# Patient Record
Sex: Male | Born: 1960 | Race: Black or African American | Hispanic: No | Marital: Single | Smoking: Current some day smoker
Health system: Southern US, Community
[De-identification: ages and names within clinical notes are randomized; demographics above are authoritative.]

## PROBLEM LIST (undated history)

## (undated) DIAGNOSIS — I10 Essential (primary) hypertension: Secondary | ICD-10-CM

---

## 2018-09-12 ENCOUNTER — Encounter (HOSPITAL_COMMUNITY): Payer: Self-pay | Admitting: Emergency Medicine

## 2018-09-12 ENCOUNTER — Other Ambulatory Visit: Payer: Self-pay

## 2018-09-12 ENCOUNTER — Emergency Department (HOSPITAL_COMMUNITY)
Admission: EM | Admit: 2018-09-12 | Discharge: 2018-09-12 | Disposition: A | Discharge status: Home / Self Care | Payer: Self-pay | Attending: Emergency Medicine | Admitting: Emergency Medicine

## 2018-09-12 DIAGNOSIS — Z0279 Encounter for issue of other medical certificate: Secondary | ICD-10-CM | POA: Insufficient documentation

## 2018-09-12 DIAGNOSIS — J069 Acute upper respiratory infection, unspecified: Secondary | ICD-10-CM | POA: Insufficient documentation

## 2018-09-12 DIAGNOSIS — Z0289 Encounter for other administrative examinations: Secondary | ICD-10-CM

## 2018-09-12 DIAGNOSIS — I10 Essential (primary) hypertension: Secondary | ICD-10-CM | POA: Insufficient documentation

## 2018-09-12 NOTE — ED Notes (Signed)
Bed: WA08 Expected date:  Expected time:  Means of arrival:  Comments: 

## 2018-09-12 NOTE — Discharge Instructions (Addendum)
Do not work if you have fevers, cough, or difficulty breathing. Get rechecked immediately if you develop difficulty breathing.

## 2018-09-12 NOTE — ED Provider Notes (Signed)
[facility]-EMERGENCY DEPT Provider Note   CSN: [id number] Arrival date & time: 09/12/18  0741    History   Chief Complaint Chief Complaint  Patient presents with  . needs work note    HPI Ricky Hernandez is a 58 y.o. male.     The history is provided by the patient. No language interpreter was used.   Ricky Hernandez is a 58 y.o. male who presents to the Emergency Department complaining of work note. He presents to the emergency department requesting a note to return to work. Last Saturday, nine days ago he developed symptoms of an upper respiratory infection with nasal congestion, sneezing, coughing, sore throat. Overall his symptoms have been resolved since Thursday. He did miss two days of work due to his symptoms. Today he requests a note stating that he can return to work. He has no known sick contacts. He has no medical problems. He denies any chest pain, shortness of breath, nausea, vomiting, fevers. Symptoms are mild and resolving. History reviewed. No pertinent past medical history.  There are no active problems to display for this patient.   History reviewed. No pertinent surgical history.      Home Medications    Prior to Admission medications   Not on File    Family History No family history on file.  Social History Social History   Tobacco Use  . Smoking status: Never Smoker  . Smokeless tobacco: Never Used  Substance Use Topics  . Alcohol use: Yes  . Drug use: Not on file     Allergies   Patient has no known allergies.   Review of Systems Review of Systems  All other systems reviewed and are negative.    Physical Exam Updated Vital Signs BP (!) 205/102 (BP Location: Left Arm)   Pulse 95   Temp 98.2 F (36.8 C) (Oral)   Resp 20   SpO2 98%   Physical Exam Vitals signs and nursing note reviewed.  Constitutional:      Appearance: He is well-developed.  HENT:     Head: Normocephalic and atraumatic.  Cardiovascular:      Rate and Rhythm: Normal rate and regular rhythm.     Heart sounds: No murmur.  Pulmonary:     Effort: Pulmonary effort is normal. No respiratory distress.     Breath sounds: Normal breath sounds.  Abdominal:     Palpations: Abdomen is soft.     Tenderness: There is no abdominal tenderness. There is no guarding or rebound.  Musculoskeletal:        General: No swelling or tenderness.  Skin:    General: Skin is warm and dry.  Neurological:     Mental Status: He is alert and oriented to person, place, and time.  Psychiatric:        Behavior: Behavior normal.      ED Treatments / Results  Labs (all labs ordered are listed, but only abnormal results are displayed) Labs Reviewed - No data to display  EKG None  Radiology No results found.  Procedures Procedures (including critical care time)  Medications Ordered in ED Medications - No data to display   Initial Impression / Assessment and Plan / ED Course  I have reviewed the triage vital signs and the nursing notes.  Pertinent labs & imaging results that were available during my care of the patient were reviewed by me and considered in my medical decision making (see chart for details).  Patient presents to the emergency department for a work excuse. He is well appearing on examination with no current symptoms. He is more than seven days since onset of his symptoms and has not had a fever during his illness. He has no risk factors for COVID exposure. Discussed with patient that if he is symptom-free he may return to work per [employer] guidelines. He does not meet current testing criteria for COVID virus.  Incidentally patient is noted to be hypertensive on ED assessment. He is asymptomatic at this time. He has no known history of hypertension. Discussed with patient importance of establishing a PCP for close outpatient follow-up and recheck. Return precautions discussed.  Ricky Hernandez was evaluated in Emergency  Department on 09/12/2018 for the symptoms described in the history of present illness. He was evaluated in the context of the global COVID-19 pandemic, which necessitated consideration that the patient might be at risk for infection with the SARS-CoV-2 virus that causes COVID-19. Institutional protocols and algorithms that pertain to the evaluation of patients at risk for COVID-19 are in a state of rapid change based on information released by regulatory bodies including the CDC and federal and state organizations. These policies and algorithms were followed during the patient's care in the ED.   Final Clinical Impressions(s) / ED Diagnoses   Final diagnoses:  Encounter to obtain excuse from work  Viral URI  Essential hypertension    ED Discharge Orders    None       Ricky Fossa, MD 09/12/18 [phone]

## 2018-09-12 NOTE — ED Triage Notes (Signed)
Pt reports that he was out of work last Monday and Tuesday for head cold and needs a note to return back. Denies being seen here and denies symptoms at this time.

## 2018-11-22 ENCOUNTER — Ambulatory Visit: Payer: Self-pay | Admitting: Internal Medicine

## 2020-06-24 ENCOUNTER — Emergency Department (HOSPITAL_COMMUNITY): Payer: Self-pay

## 2020-06-24 ENCOUNTER — Observation Stay (HOSPITAL_COMMUNITY)
Admission: EM | Admit: 2020-06-24 | Discharge: 2020-06-26 | Disposition: A | Discharge status: Home / Self Care | Payer: Self-pay | Attending: Emergency Medicine | Admitting: Emergency Medicine

## 2020-06-24 ENCOUNTER — Other Ambulatory Visit: Payer: Self-pay

## 2020-06-24 ENCOUNTER — Encounter (HOSPITAL_COMMUNITY): Payer: Self-pay

## 2020-06-24 DIAGNOSIS — S76119A Strain of unspecified quadriceps muscle, fascia and tendon, initial encounter: Secondary | ICD-10-CM | POA: Diagnosis present

## 2020-06-24 DIAGNOSIS — R2689 Other abnormalities of gait and mobility: Secondary | ICD-10-CM | POA: Insufficient documentation

## 2020-06-24 DIAGNOSIS — I10 Essential (primary) hypertension: Secondary | ICD-10-CM | POA: Insufficient documentation

## 2020-06-24 DIAGNOSIS — F17290 Nicotine dependence, other tobacco product, uncomplicated: Secondary | ICD-10-CM | POA: Insufficient documentation

## 2020-06-24 DIAGNOSIS — Z79899 Other long term (current) drug therapy: Secondary | ICD-10-CM | POA: Insufficient documentation

## 2020-06-24 DIAGNOSIS — M25562 Pain in left knee: Secondary | ICD-10-CM | POA: Insufficient documentation

## 2020-06-24 DIAGNOSIS — Z20822 Contact with and (suspected) exposure to covid-19: Secondary | ICD-10-CM | POA: Insufficient documentation

## 2020-06-24 DIAGNOSIS — S76102A Unspecified injury of left quadriceps muscle, fascia and tendon, initial encounter: Principal | ICD-10-CM | POA: Insufficient documentation

## 2020-06-24 DIAGNOSIS — W01198A Fall on same level from slipping, tripping and stumbling with subsequent striking against other object, initial encounter: Secondary | ICD-10-CM | POA: Insufficient documentation

## 2020-06-24 DIAGNOSIS — S76112A Strain of left quadriceps muscle, fascia and tendon, initial encounter: Secondary | ICD-10-CM | POA: Diagnosis present

## 2020-06-24 HISTORY — DX: Essential (primary) hypertension: I10

## 2020-06-24 MED ORDER — OXYCODONE-ACETAMINOPHEN 5-325 MG PO TABS
1.0000 | ORAL_TABLET | ORAL | Status: DC | PRN
  Administered 2020-06-24: 1 via ORAL
  Filled 2020-06-24: qty 1

## 2020-06-24 NOTE — ED Provider Notes (Addendum)
[facility] EMERGENCY DEPARTMENT Provider Note   CSN: [id number] Arrival date & time: 06/24/20  1020     History Chief Complaint  Patient presents with  . Fall  . Knee Pain  . Hip Pain    Ricky Hernandez is a 60 y.o. male with no significant past medical history presents to the ED after mechanical fall 2 days ago.  Patient states he was walking outside his door, slipped, and fell directly on his left knee. He admits to severe, 10/10 pain following the fall. Pain is worse with movement. Notes pain is located on the anterior aspect of knee and quadricepts. He is unable to fully extend left leg. Denies associated numbness/tingling. No previous injury.  Denies head injury loss of consciousness.  He is not currently on any blood thinners.  Patient states he has been unable to bear weight since the accident.  No treatment prior to arrival  History obtained from patient and past medical records. No interpreter used during encounter.      History reviewed. No pertinent past medical history.  There are no problems to display for this patient.   History reviewed. No pertinent surgical history.     History reviewed. No pertinent family history.  Social History   Tobacco Use  . Smoking status: Never Smoker  . Smokeless tobacco: Never Used  Vaping Use  . Vaping Use: Never used  Substance Use Topics  . Alcohol use: Yes    Home Medications Prior to Admission medications   Not on File    Allergies    Patient has no known allergies.  Review of Systems   Review of Systems  Musculoskeletal: Positive for arthralgias, gait problem and myalgias.  Neurological: Negative for numbness.  All other systems reviewed and are negative.   Physical Exam Updated Vital Signs BP (!) 164/98   Pulse 70   Temp 98.9 F (37.2 C)   Resp 17   Ht 5\' 9"  (1.753 m)   Wt 63.5 kg   SpO2 97%   BMI 20.67 kg/m   Physical Exam Vitals and nursing note reviewed.  Constitutional:       General: He is not in acute distress.    Appearance: He is not ill-appearing.  HENT:     Head: Normocephalic.  Eyes:     Pupils: Pupils are equal, round, and reactive to light.  Cardiovascular:     Rate and Rhythm: Normal rate and regular rhythm.     Pulses: Normal pulses.     Heart sounds: Normal heart sounds. No murmur heard. No friction rub. No gallop.   Pulmonary:     Effort: Pulmonary effort is normal.     Breath sounds: Normal breath sounds.  Abdominal:     General: Abdomen is flat. There is no distension.     Palpations: Abdomen is soft.     Tenderness: There is no abdominal tenderness. There is no guarding or rebound.  Musculoskeletal:     Cervical back: Neck supple.     Comments: Tenderness over anterior aspect of left quads. Unable to extend leg. Soft compartments. Distal pulses and sensation intact. Unable to ambulate here in the ED.   Skin:    General: Skin is warm and dry.  Neurological:     General: No focal deficit present.     Mental Status: He is alert.     ED Results / Procedures / Treatments   Labs (all labs ordered are listed, but only abnormal results are  displayed) Labs Reviewed - No data to display  EKG None  Radiology DG Knee Complete 4 Views Left  Result Date: 06/24/2020 CLINICAL DATA:  Fall from porch 06/22/2020, hip and knee pain. EXAM: LEFT KNEE - COMPLETE 4+ VIEW COMPARISON:  None. FINDINGS: A string like cluster of abnormal ossific densities is present along what appears to be a markedly thickened and indistinct distal quadriceps tendon. Although these ossifications may be chronic, the possibility of a rupture of the distal quadriceps is raised given the associated regional swelling. Scalloping and mild irregularity of the upper patella noted. There is also some mild spurring and potentially a small ossification adjacent to the lower pole of the patella. Indistinct suprapatellar bursa. Lateral spurring of the patella and of the fibular head.  Flat ossific density along the posterior margin of the distal tibia probably along the origination of the soleus muscle based on configuration. IMPRESSION: 1. Abnormal appearance of the distal quadriceps tendon, with thickening and indistinctness of the distal quadriceps tendon concerning for possible rupture. There are also some clustered ossifications in the distal quadriceps which may indicate degeneration. Electronically Signed   By: Gaylyn Rong M.D.   On: 06/24/2020 12:32   DG Hip Unilat With Pelvis 2-3 Views Left  Result Date: 06/24/2020 CLINICAL DATA:  Fall from porch, hip pain EXAM: DG HIP (WITH OR WITHOUT PELVIS) 2-3V LEFT COMPARISON:  None. FINDINGS: Small ossification in the vicinity of the left hip adductor musculature, probably chronic. Similar likely chronic ossification along the right hamstring. Unusual ossification medially along the right pubic rami, cannot exclude deformity related to an old fracture in this vicinity. Probable synovial herniation pit along the right femoral head. Lower lumbar spondylosis. Mild spurring of the left femoral head. I do not observe a discrete fracture involving the left hip. IMPRESSION: 1. 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 2. Mild spurring of the left femoral head. 3. Lower lumbar spondylosis. Electronically Signed   By: Gaylyn Rong M.D.   On: 06/24/2020 12:39    Procedures Procedures (including critical care time)  Medications Ordered in ED Medications  oxyCODONE-acetaminophen (PERCOCET/ROXICET) 5-325 MG per tablet 1 tablet (1 tablet Oral Given 06/24/20 1107)    ED Course  I have reviewed the triage vital signs and the nursing notes.  Pertinent labs & imaging results that were available during my care of the patient were reviewed by me and considered in my medical decision making (see chart for details).    MDM  Rules/Calculators/A&P                         60 year old male presents to the ED after mechanical fall 2 days ago.  No head injury or loss of consciousness.  Patient admits to left knee/quadricep pain worse with ambulation.  Unfortunately, patient waited over 12 hours prior to my initial evaluation.  Patient hypertensive upon arrival at 181/111 however, most recent vitals shows improvement in BP at 164/98.  Low suspicion for hypertensive urgency/emergency given patient is asymptomatic.  Patient in no acute distress and nontoxic-appearing.  Physical exam significant for tenderness throughout left quadriceps with inability to fully extend left leg.  Distal pulses and sensation intact. Soft compartments.  Doubt compartment syndrome.  Left hip and knee X-rays ordered at triage which I personally reviewed which demonstrate: IMPRESSION:  1. Ossifications along the left hip adductor musculature, right  pubic rami, and right hamstring tendon seem to be chronic and no  donor site is identified.  2. Mild spurring of the left femoral head.  3. Lower lumbar spondylosis.   IMPRESSION:  1. Abnormal appearance of the distal quadriceps tendon, with  thickening and indistinctness of the distal quadriceps tendon  concerning for possible rupture. There are also some clustered  ossifications in the distal quadriceps which may indicate  degeneration.    10:53 PM discussed case with Dr. Carola Frost with orthopedics who recommends MRI left knee to rule out full quadricep rupture.  If full rupture, orthopedics will admit for surgery. If partial rupture, he recommends medical admission for pain management and orthopedics will follow.   Informed by RN that patient refused labs.  Patient handed off to Dr. Judd Lien pending MRI. Patient will most likely need admission as noted above.   Final Clinical Impression(s) / ED Diagnoses Final diagnoses:  None    Rx / DC Orders ED Discharge Orders    None       Mannie Stabile, PA-C 06/24/20 2347    Mannie Stabile, PA-C 06/24/20 2348    Geoffery Lyons, MD 06/25/20 [phone]

## 2020-06-24 NOTE — ED Notes (Signed)
ED Provider at bedside. 

## 2020-06-24 NOTE — ED Notes (Signed)
Patient refusing blood work at this time, voiced frustration regarding further testing, does agree to MRI. Patient requesting PO fluids, given as requested, OK per [place], [place].

## 2020-06-24 NOTE — ED Triage Notes (Signed)
Pt reports pain to his Left hip and knee after falling Saturday night. Pt states he slipped off his porch while he was trying to go down his steps. Pt unable to put any weight on his Left side.

## 2020-06-25 ENCOUNTER — Encounter (HOSPITAL_COMMUNITY): Payer: Self-pay | Admitting: Orthopedic Surgery

## 2020-06-25 ENCOUNTER — Emergency Department (HOSPITAL_COMMUNITY): Payer: Self-pay

## 2020-06-25 ENCOUNTER — Observation Stay (HOSPITAL_COMMUNITY): Payer: Self-pay | Admitting: Anesthesiology

## 2020-06-25 ENCOUNTER — Encounter (HOSPITAL_COMMUNITY)
Admission: EM | Disposition: A | Discharge status: Home / Self Care | Payer: Self-pay | Source: Home / Self Care | Attending: Emergency Medicine

## 2020-06-25 ENCOUNTER — Observation Stay (HOSPITAL_COMMUNITY): Payer: Self-pay

## 2020-06-25 DIAGNOSIS — S76119A Strain of unspecified quadriceps muscle, fascia and tendon, initial encounter: Secondary | ICD-10-CM | POA: Insufficient documentation

## 2020-06-25 DIAGNOSIS — S76112A Strain of left quadriceps muscle, fascia and tendon, initial encounter: Secondary | ICD-10-CM | POA: Diagnosis present

## 2020-06-25 HISTORY — PX: QUADRICEPS TENDON REPAIR: SHX756

## 2020-06-25 HISTORY — PX: OTHER SURGICAL HISTORY: SHX169

## 2020-06-25 LAB — CBC WITH DIFFERENTIAL/PLATELET
Abs Immature Granulocytes: 0.03 10*3/uL (ref 0.00–0.07)
Basophils Absolute: 0 10*3/uL (ref 0.0–0.1)
Basophils Relative: 1 %
Eosinophils Absolute: 0.1 10*3/uL (ref 0.0–0.5)
Eosinophils Relative: 1 %
HCT: 43.8 % (ref 39.0–52.0)
Hemoglobin: 14.4 g/dL (ref 13.0–17.0)
Immature Granulocytes: 1 %
Lymphocytes Relative: 14 %
Lymphs Abs: 0.9 10*3/uL (ref 0.7–4.0)
MCH: 30.4 pg (ref 26.0–34.0)
MCHC: 32.9 g/dL (ref 30.0–36.0)
MCV: 92.4 fL (ref 80.0–100.0)
Monocytes Absolute: 0.7 10*3/uL (ref 0.1–1.0)
Monocytes Relative: 11 %
Neutro Abs: 4.9 10*3/uL (ref 1.7–7.7)
Neutrophils Relative %: 72 %
Platelets: 279 10*3/uL (ref 150–400)
RBC: 4.74 MIL/uL (ref 4.22–5.81)
RDW: 14.7 % (ref 11.5–15.5)
WBC: 6.7 10*3/uL (ref 4.0–10.5)
nRBC: 0 % (ref 0.0–0.2)

## 2020-06-25 LAB — RESP PANEL BY RT-PCR (FLU A&B, COVID) ARPGX2
Influenza A by PCR: NEGATIVE
Influenza B by PCR: NEGATIVE
SARS Coronavirus 2 by RT PCR: NEGATIVE

## 2020-06-25 LAB — BASIC METABOLIC PANEL
Anion gap: 13 (ref 5–15)
BUN: 19 mg/dL (ref 8–23)
CO2: 22 mmol/L (ref 22–32)
Calcium: 9.2 mg/dL (ref 8.9–10.3)
Chloride: 102 mmol/L (ref 98–111)
Creatinine, Ser: 0.77 mg/dL (ref 0.61–1.24)
GFR, Estimated: >60 mL/min (ref >60)
Glucose, Bld: 97 mg/dL (ref 70–99)
Potassium: 4.6 mmol/L (ref 3.5–5.1)
Sodium: 137 mmol/L (ref 135–145)

## 2020-06-25 LAB — PROTIME-INR
INR: 1 (ref 0.8–1.2)
Prothrombin Time: 12.5 seconds (ref 11.4–15.2)

## 2020-06-25 SURGERY — REPAIR, TENDON, QUADRICEPS
Anesthesia: General | Site: Thigh | Laterality: Right

## 2020-06-25 MED ORDER — HYDROXYZINE HCL 25 MG PO TABS
50.0000 mg | ORAL_TABLET | Freq: Three times a day (TID) | ORAL | Status: DC | PRN
  Filled 2020-06-25: qty 2

## 2020-06-25 MED ORDER — PHENYLEPHRINE 40 MCG/ML (10ML) SYRINGE FOR IV PUSH (FOR BLOOD PRESSURE SUPPORT)
PREFILLED_SYRINGE | INTRAVENOUS | Status: DC | PRN
  Administered 2020-06-25: 80 ug via INTRAVENOUS

## 2020-06-25 MED ORDER — OXYCODONE HCL 5 MG PO TABS
ORAL_TABLET | ORAL | Status: AC
  Filled 2020-06-25: qty 1

## 2020-06-25 MED ORDER — ASPIRIN 325 MG PO TABS
325.0000 mg | ORAL_TABLET | Freq: Two times a day (BID) | ORAL | Status: DC
  Administered 2020-06-25 – 2020-06-26 (×2): 325 mg via ORAL
  Filled 2020-06-25 (×2): qty 1

## 2020-06-25 MED ORDER — MIDAZOLAM HCL 2 MG/2ML IJ SOLN
INTRAMUSCULAR | Status: AC
  Filled 2020-06-25: qty 2

## 2020-06-25 MED ORDER — KETOROLAC TROMETHAMINE 15 MG/ML IJ SOLN
7.5000 mg | Freq: Four times a day (QID) | INTRAMUSCULAR | Status: AC
  Administered 2020-06-25 – 2020-06-26 (×4): 7.5 mg via INTRAVENOUS
  Filled 2020-06-25 (×3): qty 1

## 2020-06-25 MED ORDER — PHENYLEPHRINE HCL-NACL 10-0.9 MG/250ML-% IV SOLN
INTRAVENOUS | Status: DC | PRN
  Administered 2020-06-25: 20 ug/min via INTRAVENOUS

## 2020-06-25 MED ORDER — SUGAMMADEX SODIUM 200 MG/2ML IV SOLN
INTRAVENOUS | Status: DC | PRN
  Administered 2020-06-25: 200 mg via INTRAVENOUS

## 2020-06-25 MED ORDER — ONDANSETRON HCL 4 MG/2ML IJ SOLN
INTRAMUSCULAR | Status: DC | PRN
  Administered 2020-06-25: 4 mg via INTRAVENOUS

## 2020-06-25 MED ORDER — OXYCODONE HCL 5 MG PO TABS: 10.0000 mg | ORAL_TABLET | ORAL | Status: DC | PRN

## 2020-06-25 MED ORDER — HYDRALAZINE HCL 10 MG PO TABS: 10.0000 mg | ORAL_TABLET | Freq: Three times a day (TID) | ORAL | Status: DC | PRN

## 2020-06-25 MED ORDER — LABETALOL HCL 5 MG/ML IV SOLN
INTRAVENOUS | Status: AC
  Filled 2020-06-25: qty 4

## 2020-06-25 MED ORDER — PROPOFOL 10 MG/ML IV BOLUS
INTRAVENOUS | Status: DC | PRN
  Administered 2020-06-25: 50 mg via INTRAVENOUS
  Administered 2020-06-25: 150 mg via INTRAVENOUS

## 2020-06-25 MED ORDER — LIDOCAINE 2% (20 MG/ML) 5 ML SYRINGE
INTRAMUSCULAR | Status: DC | PRN
  Administered 2020-06-25: 60 mg via INTRAVENOUS

## 2020-06-25 MED ORDER — OXYCODONE HCL 5 MG PO TABS
5.0000 mg | ORAL_TABLET | ORAL | Status: DC | PRN
  Administered 2020-06-25: 10 mg via ORAL
  Filled 2020-06-25: qty 2

## 2020-06-25 MED ORDER — PROPOFOL 10 MG/ML IV BOLUS
INTRAVENOUS | Status: AC
  Filled 2020-06-25: qty 40

## 2020-06-25 MED ORDER — HYDROMORPHONE HCL 1 MG/ML IJ SOLN
INTRAMUSCULAR | Status: AC
  Filled 2020-06-25: qty 1

## 2020-06-25 MED ORDER — CHLORHEXIDINE GLUCONATE 0.12 % MT SOLN
15.0000 mL | Freq: Once | OROMUCOSAL | Status: AC
  Administered 2020-06-25: 15 mL via OROMUCOSAL
  Filled 2020-06-25: qty 15

## 2020-06-25 MED ORDER — METHOCARBAMOL 1000 MG/10ML IJ SOLN
500.0000 mg | Freq: Three times a day (TID) | INTRAMUSCULAR | Status: DC
  Filled 2020-06-25: qty 5

## 2020-06-25 MED ORDER — PHENYLEPHRINE 40 MCG/ML (10ML) SYRINGE FOR IV PUSH (FOR BLOOD PRESSURE SUPPORT)
PREFILLED_SYRINGE | INTRAVENOUS | Status: AC
  Filled 2020-06-25: qty 10

## 2020-06-25 MED ORDER — ONDANSETRON HCL 4 MG/2ML IJ SOLN
INTRAMUSCULAR | Status: AC
  Filled 2020-06-25: qty 2

## 2020-06-25 MED ORDER — HYDROMORPHONE HCL 1 MG/ML IJ SOLN
0.2500 mg | INTRAMUSCULAR | Status: DC | PRN
  Administered 2020-06-25 (×4): 0.5 mg via INTRAVENOUS

## 2020-06-25 MED ORDER — ACETAMINOPHEN 500 MG PO TABS
1000.0000 mg | ORAL_TABLET | Freq: Four times a day (QID) | ORAL | Status: DC
  Administered 2020-06-25 – 2020-06-26 (×5): 1000 mg via ORAL
  Filled 2020-06-25 (×4): qty 2

## 2020-06-25 MED ORDER — POLYETHYLENE GLYCOL 3350 17 G PO PACK
17.0000 g | PACK | Freq: Every day | ORAL | Status: DC
  Administered 2020-06-25: 17 g via ORAL
  Filled 2020-06-25 (×2): qty 1

## 2020-06-25 MED ORDER — FENTANYL CITRATE (PF) 250 MCG/5ML IJ SOLN
INTRAMUSCULAR | Status: DC | PRN
  Administered 2020-06-25 (×5): 50 ug via INTRAVENOUS

## 2020-06-25 MED ORDER — EPHEDRINE 5 MG/ML INJ
INTRAVENOUS | Status: AC
  Filled 2020-06-25: qty 10

## 2020-06-25 MED ORDER — FENTANYL CITRATE (PF) 250 MCG/5ML IJ SOLN
INTRAMUSCULAR | Status: AC
  Filled 2020-06-25: qty 5

## 2020-06-25 MED ORDER — DEXAMETHASONE SODIUM PHOSPHATE 10 MG/ML IJ SOLN
INTRAMUSCULAR | Status: DC | PRN
  Administered 2020-06-25: 5 mg via INTRAVENOUS

## 2020-06-25 MED ORDER — ONDANSETRON HCL 4 MG/2ML IJ SOLN: 4.0000 mg | Freq: Four times a day (QID) | INTRAMUSCULAR | Status: DC | PRN

## 2020-06-25 MED ORDER — HYDROMORPHONE BOLUS VIA INFUSION: 0.2500 mg | INTRAVENOUS | Status: DC | PRN

## 2020-06-25 MED ORDER — CEFAZOLIN SODIUM 1 G IJ SOLR
INTRAMUSCULAR | Status: AC
  Filled 2020-06-25: qty 20

## 2020-06-25 MED ORDER — 0.9 % SODIUM CHLORIDE (POUR BTL) OPTIME
TOPICAL | Status: DC | PRN
  Administered 2020-06-25: 1000 mL

## 2020-06-25 MED ORDER — LIDOCAINE 2% (20 MG/ML) 5 ML SYRINGE
INTRAMUSCULAR | Status: AC
  Filled 2020-06-25: qty 5

## 2020-06-25 MED ORDER — LABETALOL HCL 5 MG/ML IV SOLN
10.0000 mg | Freq: Once | INTRAVENOUS | Status: AC
  Administered 2020-06-25: 10 mg via INTRAVENOUS

## 2020-06-25 MED ORDER — EPHEDRINE SULFATE-NACL 50-0.9 MG/10ML-% IV SOSY
PREFILLED_SYRINGE | INTRAVENOUS | Status: DC | PRN
  Administered 2020-06-25: 10 mg via INTRAVENOUS

## 2020-06-25 MED ORDER — HYDROMORPHONE HCL 1 MG/ML IJ SOLN: 0.5000 mg | INTRAMUSCULAR | Status: DC | PRN

## 2020-06-25 MED ORDER — ORAL CARE MOUTH RINSE: 15.0000 mL | Freq: Once | OROMUCOSAL | Status: AC

## 2020-06-25 MED ORDER — CEFAZOLIN SODIUM-DEXTROSE 1-4 GM/50ML-% IV SOLN
1.0000 g | Freq: Four times a day (QID) | INTRAVENOUS | Status: AC
  Administered 2020-06-25 – 2020-06-26 (×3): 1 g via INTRAVENOUS
  Filled 2020-06-25 (×3): qty 50

## 2020-06-25 MED ORDER — FENTANYL CITRATE (PF) 100 MCG/2ML IJ SOLN
INTRAMUSCULAR | Status: AC
  Filled 2020-06-25: qty 2

## 2020-06-25 MED ORDER — POTASSIUM CHLORIDE IN NACL 20-0.9 MEQ/L-% IV SOLN
INTRAVENOUS | Status: DC
  Filled 2020-06-25: qty 1000

## 2020-06-25 MED ORDER — METOCLOPRAMIDE HCL 5 MG PO TABS: 5.0000 mg | ORAL_TABLET | Freq: Three times a day (TID) | ORAL | Status: DC | PRN

## 2020-06-25 MED ORDER — DEXAMETHASONE SODIUM PHOSPHATE 10 MG/ML IJ SOLN
INTRAMUSCULAR | Status: AC
  Filled 2020-06-25: qty 1

## 2020-06-25 MED ORDER — ROCURONIUM BROMIDE 10 MG/ML (PF) SYRINGE
PREFILLED_SYRINGE | INTRAVENOUS | Status: AC
  Filled 2020-06-25: qty 10

## 2020-06-25 MED ORDER — ACETAMINOPHEN 325 MG PO TABS: 325.0000 mg | ORAL_TABLET | Freq: Four times a day (QID) | ORAL | Status: DC | PRN

## 2020-06-25 MED ORDER — OXYCODONE HCL 5 MG/5ML PO SOLN: 5.0000 mg | Freq: Once | ORAL | Status: AC | PRN

## 2020-06-25 MED ORDER — SODIUM CHLORIDE 0.9 % IV SOLN: INTRAVENOUS | Status: DC

## 2020-06-25 MED ORDER — LACTATED RINGERS IV SOLN: INTRAVENOUS | Status: DC

## 2020-06-25 MED ORDER — METOCLOPRAMIDE HCL 5 MG/ML IJ SOLN: 5.0000 mg | Freq: Three times a day (TID) | INTRAMUSCULAR | Status: DC | PRN

## 2020-06-25 MED ORDER — DOCUSATE SODIUM 100 MG PO CAPS
100.0000 mg | ORAL_CAPSULE | Freq: Two times a day (BID) | ORAL | Status: DC
  Administered 2020-06-25 – 2020-06-26 (×3): 100 mg via ORAL
  Filled 2020-06-25 (×3): qty 1

## 2020-06-25 MED ORDER — OXYCODONE HCL 5 MG PO TABS
5.0000 mg | ORAL_TABLET | Freq: Once | ORAL | Status: AC | PRN
  Administered 2020-06-25: 5 mg via ORAL

## 2020-06-25 MED ORDER — ONDANSETRON HCL 4 MG PO TABS: 4.0000 mg | ORAL_TABLET | Freq: Four times a day (QID) | ORAL | Status: DC | PRN

## 2020-06-25 MED ORDER — ROCURONIUM BROMIDE 10 MG/ML (PF) SYRINGE
PREFILLED_SYRINGE | INTRAVENOUS | Status: DC | PRN
  Administered 2020-06-25: 10 mg via INTRAVENOUS
  Administered 2020-06-25: 60 mg via INTRAVENOUS

## 2020-06-25 MED ORDER — METHOCARBAMOL 750 MG PO TABS
750.0000 mg | ORAL_TABLET | Freq: Three times a day (TID) | ORAL | Status: DC
  Administered 2020-06-25 – 2020-06-26 (×3): 750 mg via ORAL
  Filled 2020-06-25 (×5): qty 1

## 2020-06-25 MED ORDER — FENTANYL CITRATE (PF) 100 MCG/2ML IJ SOLN
25.0000 ug | INTRAMUSCULAR | Status: DC | PRN
  Administered 2020-06-25 (×2): 50 ug via INTRAVENOUS

## 2020-06-25 MED ORDER — CEFAZOLIN SODIUM-DEXTROSE 2-3 GM-%(50ML) IV SOLR
INTRAVENOUS | Status: DC | PRN
  Administered 2020-06-25: 2 g via INTRAVENOUS

## 2020-06-25 MED ORDER — MIDAZOLAM HCL 2 MG/2ML IJ SOLN
INTRAMUSCULAR | Status: DC | PRN
  Administered 2020-06-25: 2 mg via INTRAVENOUS

## 2020-06-25 SURGICAL SUPPLY — 51 items
BNDG COHESIVE 4X5 TAN STRL (GAUZE/BANDAGES/DRESSINGS) ×3 IMPLANT
BNDG ELASTIC 4X5.8 VLCR STR LF (GAUZE/BANDAGES/DRESSINGS) ×3 IMPLANT
BNDG ELASTIC 6X5.8 VLCR STR LF (GAUZE/BANDAGES/DRESSINGS) ×3 IMPLANT
BNDG GAUZE ELAST 4 BULKY (GAUZE/BANDAGES/DRESSINGS) ×3 IMPLANT
BRUSH SCRUB EZ PLAIN DRY (MISCELLANEOUS) ×6 IMPLANT
COVER SURGICAL LIGHT HANDLE (MISCELLANEOUS) ×3 IMPLANT
COVER WAND RF STERILE (DRAPES) ×3 IMPLANT
DRAPE INCISE IOBAN 66X45 STRL (DRAPES) ×3 IMPLANT
DRAPE U-SHAPE 47X51 STRL (DRAPES) ×3 IMPLANT
DRSG ADAPTIC 3X8 NADH LF (GAUZE/BANDAGES/DRESSINGS) ×3 IMPLANT
DRSG MEPITEL 4X7.2 (GAUZE/BANDAGES/DRESSINGS) ×3 IMPLANT
DRSG PAD ABDOMINAL 8X10 ST (GAUZE/BANDAGES/DRESSINGS) ×3 IMPLANT
ELECT REM PT RETURN 9FT ADLT (ELECTROSURGICAL) ×3
ELECTRODE REM PT RTRN 9FT ADLT (ELECTROSURGICAL) ×2 IMPLANT
GAUZE SPONGE 4X4 12PLY STRL (GAUZE/BANDAGES/DRESSINGS) ×3 IMPLANT
GLOVE BIO SURGEON STRL SZ7.5 (GLOVE) ×3 IMPLANT
GLOVE BIO SURGEON STRL SZ8 (GLOVE) ×3 IMPLANT
GLOVE BIOGEL PI IND STRL 7.5 (GLOVE) ×2 IMPLANT
GLOVE BIOGEL PI IND STRL 8 (GLOVE) ×2 IMPLANT
GLOVE BIOGEL PI INDICATOR 7.5 (GLOVE) ×1
GLOVE BIOGEL PI INDICATOR 8 (GLOVE) ×1
GOWN STRL REUS W/ TWL LRG LVL3 (GOWN DISPOSABLE) ×4 IMPLANT
GOWN STRL REUS W/ TWL XL LVL3 (GOWN DISPOSABLE) ×2 IMPLANT
GOWN STRL REUS W/TWL LRG LVL3 (GOWN DISPOSABLE) ×2
GOWN STRL REUS W/TWL XL LVL3 (GOWN DISPOSABLE) ×1
IMMOBILIZER KNEE 20 (SOFTGOODS) ×3
IMMOBILIZER KNEE 20 THIGH 36 (SOFTGOODS) ×2 IMPLANT
KIT BASIN OR (CUSTOM PROCEDURE TRAY) ×3 IMPLANT
KIT TURNOVER KIT B (KITS) ×3 IMPLANT
MANIFOLD NEPTUNE II (INSTRUMENTS) ×3 IMPLANT
NEEDLE 1/2 CIR MAYO (NEEDLE) ×3 IMPLANT
NS IRRIG 1000ML POUR BTL (IV SOLUTION) ×3 IMPLANT
PACK ORTHO EXTREMITY (CUSTOM PROCEDURE TRAY) ×3 IMPLANT
PAD ARMBOARD 7.5X6 YLW CONV (MISCELLANEOUS) ×6 IMPLANT
PAD CAST 4YDX4 CTTN HI CHSV (CAST SUPPLIES) ×2 IMPLANT
PADDING CAST COTTON 4X4 STRL (CAST SUPPLIES) ×1
PADDING CAST COTTON 6X4 STRL (CAST SUPPLIES) ×3 IMPLANT
RETRIEVER SUT HEWSON (MISCELLANEOUS) ×3 IMPLANT
SPONGE LAP 18X18 RF (DISPOSABLE) ×3 IMPLANT
STAPLER VISISTAT 35W (STAPLE) IMPLANT
STOCKINETTE IMPERVIOUS 9X36 MD (GAUZE/BANDAGES/DRESSINGS) ×3 IMPLANT
SUT FIBERWIRE #5 38 CONV NDL (SUTURE) ×6
SUT VIC AB 0 CT1 27 (SUTURE)
SUT VIC AB 0 CT1 27XBRD ANBCTR (SUTURE) IMPLANT
SUT VIC AB 1 CTX 27 (SUTURE) ×6 IMPLANT
SUT VIC AB 2-0 CT3 27 (SUTURE) IMPLANT
SUT VIC AB 2-0 CTB1 (SUTURE) IMPLANT
SUTURE FIBERWR #5 38 CONV NDL (SUTURE) ×4 IMPLANT
TOWEL GREEN STERILE (TOWEL DISPOSABLE) ×6 IMPLANT
TOWEL GREEN STERILE FF (TOWEL DISPOSABLE) ×3 IMPLANT
WATER STERILE IRR 1000ML POUR (IV SOLUTION) ×3 IMPLANT

## 2020-06-25 NOTE — Progress Notes (Signed)
1335 Received pt from PACU, A&O x4. LLE with ace wrap dry and intact. Left knee immobilizer on.

## 2020-06-25 NOTE — Brief Op Note (Signed)
06/25/2020  11:11 AM  PATIENT:  Ricky Hernandez  60 y.o. male  PRE-OPERATIVE DIAGNOSIS:  Quadricep Tendon Rupture  POST-OPERATIVE DIAGNOSIS:  Quadricep Tendon Rupture  PROCEDURE:  Procedure(s): REPAIR QUADRICEP TENDON (Left)  SURGEON:  Surgeon(s) and Role:    Myrene Galas, MD - Primary  ASSISTANTS: PA Student   ANESTHESIA:   general  EBL:  50 mL   BLOOD ADMINISTERED:none  DRAINS: none   LOCAL MEDICATIONS USED:  NONE  SPECIMEN:  No Specimen  DISPOSITION OF SPECIMEN:  N/A  COUNTS:  YES  TOURNIQUET:  * No tourniquets in log *  DICTATION: .Other Dictation: Dictation Number [id number]  PLAN OF CARE: Admit for overnight observation  PATIENT DISPOSITION:  PACU - hemodynamically stable.   Delay start of Pharmacological VTE agent (>24hrs) due to surgical blood loss or risk of bleeding: no

## 2020-06-25 NOTE — H&P (Signed)
[facility] H&P/Consult     Patient ID: Ricky Hernandez MRN: [id number] DOB/AGE: 12-18-1960 60 y.o.  Chief Complaint:  Left knee pain HPI: Ricky Hernandez is an 60 y.o. male.who fell off of his porch two days ago with loss of left knee extension. Has been unable to care for himself at home. Denies other injuries. No tingling or numbness. Pain minimal at rest, dull, aching, severe with attempted extension.   Past Medical History:  Diagnosis Date  . Hypertension     History reviewed. No pertinent surgical history.  History reviewed. No pertinent family history. Social History:  reports that he has been smoking cigars. He has never used smokeless tobacco. He reports current alcohol use. He reports that he does not use drugs.  Allergies: No Known Allergies  No medications prior to admission.    Results for orders placed or performed during the hospital encounter of 06/24/20 (from the past 48 hour(s))  Protime-INR     Status: None   Collection Time: 06/25/20  2:43 AM  Result Value Ref Range   Prothrombin Time 12.5 11.4 - 15.2 seconds   INR 1.0 0.8 - 1.2    Comment: (NOTE) INR goal varies based on device and disease states. Performed at [facility], [address], [place], [place] [address]   CBC with Differential     Status: None   Collection Time: 06/25/20  3:00 AM  Result Value Ref Range   WBC 6.7 4.0 - 10.5 K/uL   RBC 4.74 4.22 - 5.81 MIL/uL   Hemoglobin 14.4 13.0 - 17.0 g/dL   HCT 32.9 51.8 - 84.1 %   MCV 92.4 80.0 - 100.0 fL   MCH 30.4 26.0 - 34.0 pg   MCHC 32.9 30.0 - 36.0 g/dL   RDW 66.0 63.0 - 16.0 %   Platelets 279 150 - 400 K/uL   nRBC 0.0 0.0 - 0.2 %   Neutrophils Relative % 72 %   Neutro Abs 4.9 1.7 - 7.7 K/uL   Lymphocytes Relative 14 %   Lymphs Abs 0.9 0.7 - 4.0 K/uL   Monocytes Relative 11 %   Monocytes Absolute 0.7 0.1 - 1.0 K/uL   Eosinophils Relative 1 %   Eosinophils Absolute 0.1 0.0 - 0.5 K/uL   Basophils  Relative 1 %   Basophils Absolute 0.0 0.0 - 0.1 K/uL   Immature Granulocytes 1 %   Abs Immature Granulocytes 0.03 0.00 - 0.07 K/uL    Comment: Performed at [facility], [address], [place], [place] [address]  Basic metabolic panel     Status: None   Collection Time: 06/25/20  3:00 AM  Result Value Ref Range   Sodium 137 135 - 145 mmol/L   Potassium 4.6 3.5 - 5.1 mmol/L    Comment: SPECIMEN HEMOLYZED. HEMOLYSIS MAY AFFECT INTEGRITY OF RESULTS.   Chloride 102 98 - 111 mmol/L   CO2 22 22 - 32 mmol/L   Glucose, Bld 97 70 - 99 mg/dL    Comment: Glucose reference range applies only to samples taken after fasting for at least 8 hours.   BUN 19 8 - 23 mg/dL   Creatinine, Ser 3.55 0.61 - 1.24 mg/dL   Calcium 9.2 8.9 - 73.2 mg/dL   GFR, Estimated >20 >25 mL/min    Comment: (NOTE) Calculated using the CKD-EPI Creatinine Equation (2021)    Anion gap 13 5 - 15    Comment: Performed at [facility],  [address] [place], [place] [address]  Resp Panel by RT-PCR (Flu A&B, Covid) Nasopharyngeal Swab     Status: None   Collection Time: 06/25/20  3:04 AM   Specimen: Nasopharyngeal Swab; Nasopharyngeal(NP) swabs in vial transport medium  Result Value Ref Range   SARS Coronavirus 2 by RT PCR NEGATIVE NEGATIVE    Comment: (NOTE) SARS-CoV-2 target nucleic acids are NOT DETECTED.  The SARS-CoV-2 RNA is generally detectable in upper respiratory specimens during the acute phase of infection. The lowest concentration of SARS-CoV-2 viral copies this assay can detect is 138 copies/mL. A negative result does not preclude SARS-Cov-2 infection and should not be used as the sole basis for treatment or other patient management decisions. A negative result may occur with  improper specimen collection/handling, submission of specimen other than nasopharyngeal swab, presence of viral mutation(s) within the areas targeted by this assay, and inadequate number of viral copies(<138 copies/mL). A  negative result must be combined with clinical observations, patient history, and epidemiological information. The expected result is Negative.  Fact Sheet for Patients:  BloggerCourse.com  Fact Sheet for Healthcare Providers:  SeriousBroker.it  This test is no t yet approved or cleared by the [place] FDA and  has been authorized for detection and/or diagnosis of SARS-CoV-2 by FDA under an Emergency Use Authorization (EUA). This EUA will remain  in effect (meaning this test can be used) for the duration of the COVID-19 declaration under Section 564(b)(1) of the Act, 21 U.S.C.section 360bbb-3(b)(1), unless the authorization is terminated  or revoked sooner.       Influenza A by PCR NEGATIVE NEGATIVE   Influenza B by PCR NEGATIVE NEGATIVE    Comment: (NOTE) The Xpert Xpress SARS-CoV-2/FLU/RSV plus assay is intended as an aid in the diagnosis of influenza from Nasopharyngeal swab specimens and should not be used as a sole basis for treatment. Nasal washings and aspirates are unacceptable for Xpert Xpress SARS-CoV-2/FLU/RSV testing.  Fact Sheet for Patients: BloggerCourse.com  Fact Sheet for Healthcare Providers: SeriousBroker.it  This test is not yet approved or cleared by the [place] FDA and has been authorized for detection and/or diagnosis of SARS-CoV-2 by FDA under an Emergency Use Authorization (EUA). This EUA will remain in effect (meaning this test can be used) for the duration of the COVID-19 declaration under Section 564(b)(1) of the Act, 21 U.S.C. section 360bbb-3(b)(1), unless the authorization is terminated or revoked.  Performed at [facility], [address], [place], [place] [address]    MR KNEE LEFT WO CONTRAST  Result Date: 06/25/2020 CLINICAL DATA:  Larey Seat from a porch and injured knee. Clinical suspicion for quad rupture. EXAM: MRI OF THE  LEFT KNEE WITHOUT CONTRAST TECHNIQUE: Multiplanar, multisequence MR imaging of the knee was performed. No intravenous contrast was administered. COMPARISON:  Radiographs 06/24/2020 FINDINGS: MENISCI Medial meniscus: Intact. Mild intrasubstance degenerative type changes. Lateral meniscus:  Intact LIGAMENTS Cruciates:  Intact Collaterals:  Intact.  Mild MCL and pes anserine bursitis. CARTILAGE Patellofemoral:  Normal Medial:  Normal Lateral:  Normal Joint:  Moderate-sized joint effusion. Popliteal Fossa:  No popliteal mass.  Small leaking Baker's cyst. Extensor Mechanism: The patella retinacular structures are intact. The patellar tendon is intact. Near complete rupture of the quadriceps tendon. There are some intact fibers of the vastus lateralis component. The rest of the tendon is completely torn and retracted maximum 2 cm. Bones:  Intact bony structures. Other: Moderate fluid/edema/hemorrhage surrounding the visualized distal quadriceps muscles. IMPRESSION: 1. Near complete rupture of  the quadriceps tendon. There are some intact fibers of the vastus lateralis. 2. Intact ligamentous structures and no meniscal tears. 3. Moderate-sized joint effusion and small leaking Baker's cyst. Electronically Signed   By: Rudie Meyer M.D.   On: 06/25/2020 07:50   DG Knee Complete 4 Views Left  Result Date: 06/24/2020 CLINICAL DATA:  Fall from porch 06/22/2020, hip and knee pain. EXAM: LEFT KNEE - COMPLETE 4+ VIEW COMPARISON:  None. FINDINGS: A string like cluster of abnormal ossific densities is present along what appears to be a markedly thickened and indistinct distal quadriceps tendon. Although these ossifications may be chronic, the possibility of a rupture of the distal quadriceps is raised given the associated regional swelling. Scalloping and mild irregularity of the upper patella noted. There is also some mild spurring and potentially a small ossification adjacent to the lower pole of the patella. Indistinct  suprapatellar bursa. Lateral spurring of the patella and of the fibular head. Flat ossific density along the posterior margin of the distal tibia probably along the origination of the soleus muscle based on configuration. IMPRESSION: 1. Abnormal appearance of the distal quadriceps tendon, with thickening and indistinctness of the distal quadriceps tendon concerning for possible rupture. There are also some clustered ossifications in the distal quadriceps which may indicate degeneration. Electronically Signed   By: Gaylyn Rong M.D.   On: 06/24/2020 12:32   DG Hip Unilat With Pelvis 2-3 Views Left  Result Date: 06/24/2020 CLINICAL DATA:  Fall from porch, hip pain EXAM: DG HIP (WITH OR WITHOUT PELVIS) 2-3V LEFT COMPARISON:  None. FINDINGS: Small ossification in the vicinity of the left hip adductor musculature, probably chronic. Similar likely chronic ossification along the right hamstring. Unusual ossification medially along the right pubic rami, cannot exclude deformity related to an old fracture in this vicinity. Probable synovial herniation pit along the right femoral head. Lower lumbar spondylosis. Mild spurring of the left femoral head. I do not observe a discrete fracture involving the left hip. IMPRESSION: 1. 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 2. Mild spurring of the left femoral head. 3. Lower lumbar spondylosis. Electronically Signed   By: Gaylyn Rong M.D.   On: 06/24/2020 12:39    ROS No recent fever, bleeding abnormalities, urologic dysfunction, GI problems, or weight gain.  Blood pressure (!) 147/100, pulse 81, temperature 98.3 F (36.8 C), temperature source Oral, resp. rate 16, height 5\' 9"  (1.753 m), weight 63.5 kg, SpO2 100 %. Physical Exam NCAT Heart RRR Lungs no retractions or audible wheezing' Abd ND LLE Tender left quad, no active extension of  knee  Edema/ swelling controlled  Sens: DPN, SPN, TN intact  Motor: EHL, FHL, and lessor toe ext and flex all intact grossly  Brisk cap refill, warm to touch   Assessment/Plan  Left Quad rupture  I discussed with the patient the risks and benefits of surgery, including the possibility of infection, nerve injury, vessel injury, wound breakdown, arthritis, symptomatic hardware, DVT/ PE, loss of motion, failure of repair, and need for further surgery among others. He acknowledged these risks and wished to proceed.  , MD [facility], [facility] [phone]  06/25/2020, 8:27 AM  [facility] [address] [place] [phone] (F)

## 2020-06-25 NOTE — Anesthesia Procedure Notes (Signed)
Procedure Name: Intubation Date/Time: 06/25/2020 8:59 AM Performed by: Reece Agar, CRNA Pre-anesthesia Checklist: Patient identified, Emergency Drugs available, Suction available and Patient being monitored Patient Re-evaluated:Patient Re-evaluated prior to induction Oxygen Delivery Method: Circle System Utilized Preoxygenation: Pre-oxygenation with 100% oxygen Induction Type: IV induction Ventilation: Mask ventilation without difficulty Laryngoscope Size: Mac and 3 Grade View: Grade II Tube type: Oral Tube size: 7.5 mm Number of attempts: 1 Airway Equipment and Method: Stylet Placement Confirmation: ETT inserted through vocal cords under direct vision,  positive ETCO2 and breath sounds checked- equal and bilateral Secured at: 23 cm Tube secured with: Tape Dental Injury: Teeth and Oropharynx as per pre-operative assessment  Difficulty Due To: Difficulty was unanticipated and Difficult Airway- due to anterior larynx Comments: Grade 4 view MAC 4 blade, 2nd attempt Mil 3 with grade 4 view by CRNA, 3rd attempt successful with 2b view by MDA. Pt with poor dentition, remains the same as pre-op.

## 2020-06-25 NOTE — ED Notes (Signed)
ED Provider at bedside. 

## 2020-06-25 NOTE — Anesthesia Preprocedure Evaluation (Signed)
Anesthesia Evaluation  Patient identified by MRN, date of birth, ID band Patient awake    Reviewed: Allergy & Precautions, H&P , NPO status , Patient's Chart, lab work & pertinent test results  Airway Mallampati: II   Neck ROM: full    Dental   Pulmonary Current Smoker and Patient abstained from smoking.,    breath sounds clear to auscultation       Cardiovascular hypertension,  Rhythm:regular Rate:Normal     Neuro/Psych    GI/Hepatic   Endo/Other    Renal/GU      Musculoskeletal Quadriceps tendon rupture   Abdominal   Peds  Hematology   Anesthesia Other Findings   Reproductive/Obstetrics                             Anesthesia Physical Anesthesia Plan  ASA: II  Anesthesia Plan: General   Post-op Pain Management:    Induction: Intravenous  PONV Risk Score and Plan: 1 and Dexamethasone, Midazolam and Treatment may vary due to age or medical condition  Airway Management Planned: Oral ETT  Additional Equipment:   Intra-op Plan:   Post-operative Plan: Extubation in OR  Informed Consent: I have reviewed the patients History and Physical, chart, labs and discussed the procedure including the risks, benefits and alternatives for the proposed anesthesia with the patient or authorized representative who has indicated his/her understanding and acceptance.       Plan Discussed with: CRNA, Anesthesiologist and Surgeon  Anesthesia Plan Comments:         Anesthesia Quick Evaluation

## 2020-06-25 NOTE — Plan of Care (Signed)
  Problem: Clinical Measurements: Goal: Ability to maintain clinical measurements within normal limits will improve Outcome: Progressing   Problem: Coping: Goal: Level of anxiety will decrease Outcome: Progressing   Problem: Elimination: Goal: Will not experience complications related to bowel motility Outcome: Progressing   Problem: Pain Managment: Goal: General experience of comfort will improve Outcome: Progressing   Problem: Safety: Goal: Ability to remain free from injury will improve Outcome: Progressing   Problem: Skin Integrity: Goal: Risk for impaired skin integrity will decrease Outcome: Progressing   

## 2020-06-25 NOTE — Op Note (Signed)
NAMEDACIAN, ORRICO MEDICAL RECORD [id number] ACCOUNT [id number] DATE OF BIRTH:12-21-1960 FACILITY: [facility] LOCATION: [facility] PHYSICIAN:Everlena Mackley H. Wynston Romey, MD  OPERATIVE REPORT  DATE OF PROCEDURE:  06/25/2020  PREOPERATIVE DIAGNOSIS:  Left quadriceps tendon tear.  POSTOPERATIVE DIAGNOSIS:  Left quadriceps tendon tear.  PROCEDURE:  Repair of left quadriceps tendon.  SURGEON:  Myrene Galas, MD  ASSISTANT:  PA student.  ANESTHESIA:  General.  COMPLICATIONS:  None.  SPECIMENS:  None.  DISPOSITION:  To PACU.  CONDITION:  Stable.  BRIEF SUMMARY OF INDICATION FOR PROCEDURE:  The patient is a 60 year old who sustained a fall several days ago with subsequent difficulty ambulating or mobilizing.  The patient failed to progress at home and presented for further evaluation and treatment  after failure to progress at home.  Physical examination and subsequent MRI confirmed rupture of the quadriceps tendon.  I discussed with the patient the risks and benefits of surgical repair which was indicated to restore extensor function.  These  risks included failure of the repair, loss of motion, stiffness, weakness, nerve injury, vessel injury, infection, DVT, PE and multiple others.  After acknowledging these risks, the patient did provide consent to proceed.  BRIEF SUMMARY OF PROCEDURE:  The patient received preoperative antibiotics, was taken to the operating room where general anesthesia was induced.  The operative lower extremity was prepped and draped in the usual sterile fashion using chlorhexidine wash  and Betadine scrub and paint.  After draping, a timeout was held and then a midline incision centered over the proximal aspect of the patella was made and dissection carried carefully down through the subcutaneous tissues and deep fascia where the  interval over the quadriceps tendon was developed.  The tendon was disrupted and there was a large serosanguineous effusion that was evacuated with  suction.  The proximal aspect of the patella where the quadriceps had avulsed had a smooth texture  consistent with prolonged inflammation.  Similarly, there was myxoid degenerative change visible within the distal aspect of the quadriceps tendon, including calcification or even ossification within its terminal aspect.  This was debrided back using a  combination of the 10 blade, then rongeur and bone rasp directly on the patella to get back to healthy bleeding bone.  I then used a #5 FiberWire with 2 limbs going through the center in each edge of the tendon with a #5 FiberWire and a [place] technique.   This gave excellent control over the tendon.  Three bone tunnels were then drilled into the central aspect of the patella and the limb was brought out on the inferior surface where each limb was tied independently.  An additional imbrication suture  with #2 FiberWire was brought from some remaining tendon tissue on the inferior surface of the proximal pole and up through the suture and tendon and then after this imbrication suture was tied, we then were able to maintain the limb and further augment  the distal and lateral repair of the tendon as well.  #1 Vicryl figure-of-eight was then taken down the lateral retinacular repair and also on the medial side, the medial tissue was in rather poor condition such that only 2 sutures had sufficient  structural integrity.  The wounds were irrigated thoroughly.  The knee was taken through a gentle range of motion, demonstrating no gapping whatsoever and excellent apposition of the tendon repair.  A 0 Vicryl, 2-0 Vicryl and 2-0 nylon closure was  performed and then a sterile gently compressive dressing with Adaptic gauze, Webril and Ace  from foot to thigh and then a knee immobilizer.  PA student was present and assisting throughout and assistant was necessary to control the tendon during repair.   He also assisted with wound closure.  It should be noted that the  midline incision did avoid a small traumatic abrasion near the distal pole.  PROGNOSIS:  The patient will be weightbearing as tolerated in full extension and will work with therapy to make sure adequate with transfers and ADLs, maintaining these restrictions before discharge.  Pharmacologic DVT prophylaxis with aspirin and will  have unrestricted range of motion of the hip and ankle.  We will see him back in the office in 14 days for removal of sutures.  Compliance will be necessary for a positive outcome.  HN/NUANCE  D:06/25/2020 T:06/25/2020 JOB:[id number]

## 2020-06-25 NOTE — Transfer of Care (Signed)
Immediate Anesthesia Transfer of Care Note  Patient: Ricky Hernandez  Procedure(s) Performed: REPAIR QUADRICEP TENDON (Left Thigh)  Patient Location: PACU  Anesthesia Type:General  Level of Consciousness: drowsy  Airway & Oxygen Therapy: Patient Spontanous Breathing and Patient connected to face mask oxygen  Post-op Assessment: Report given to RN and Post -op Vital signs reviewed and stable  Post vital signs: Reviewed and stable  Last Vitals:  Vitals Value Taken Time  BP 180/95 06/25/20 1107  Temp 36.4 C 06/25/20 1107  Pulse 68 06/25/20 1109  Resp 9 06/25/20 1109  SpO2 100 % 06/25/20 1109  Vitals shown include unvalidated device data.  Last Pain:  Vitals:   06/25/20 1107  TempSrc:   PainSc: 0-No pain         Complications: No complications documented.

## 2020-06-26 ENCOUNTER — Encounter (HOSPITAL_COMMUNITY): Payer: Self-pay | Admitting: Orthopedic Surgery

## 2020-06-26 LAB — VITAMIN D 25 HYDROXY (VIT D DEFICIENCY, FRACTURES): Vit D, 25-Hydroxy: 10.21 ng/mL — ABNORMAL LOW (ref 30–100)

## 2020-06-26 MED ORDER — VITAMIN D 125 MCG (5000 UT) PO CAPS: 1.0000 | ORAL_CAPSULE | Freq: Every day | ORAL | 11 refills | Status: AC

## 2020-06-26 MED ORDER — METHOCARBAMOL 500 MG PO TABS: 500.0000 mg | ORAL_TABLET | Freq: Four times a day (QID) | ORAL | 0 refills | Status: AC | PRN

## 2020-06-26 MED ORDER — ASCORBIC ACID 1000 MG PO TABS
1000.0000 mg | ORAL_TABLET | Freq: Every day | ORAL | 0 refills | Status: AC
Start: 2020-06-26 — End: ?

## 2020-06-26 MED ORDER — VITAMIN D 25 MCG (1000 UNIT) PO TABS
2000.0000 [IU] | ORAL_TABLET | Freq: Two times a day (BID) | ORAL | Status: DC
  Administered 2020-06-26: 2000 [IU] via ORAL
  Filled 2020-06-26: qty 2

## 2020-06-26 MED ORDER — VITAMIN D (ERGOCALCIFEROL) 1.25 MG (50000 UNIT) PO CAPS
50000.0000 [IU] | ORAL_CAPSULE | ORAL | Status: DC
  Administered 2020-06-26: 50000 [IU] via ORAL
  Filled 2020-06-26: qty 1

## 2020-06-26 MED ORDER — ASCORBIC ACID 500 MG PO TABS
1000.0000 mg | ORAL_TABLET | Freq: Every day | ORAL | Status: DC
  Administered 2020-06-26: 1000 mg via ORAL
  Filled 2020-06-26: qty 2

## 2020-06-26 MED ORDER — VITAMIN D (ERGOCALCIFEROL) 1.25 MG (50000 UNIT) PO CAPS
50000.0000 [IU] | ORAL_CAPSULE | ORAL | 2 refills | Status: AC
Start: 2020-07-03 — End: ?

## 2020-06-26 MED ORDER — ACETAMINOPHEN 325 MG PO TABS: 500.0000 mg | ORAL_TABLET | Freq: Two times a day (BID) | ORAL | 0 refills | Status: AC

## 2020-06-26 MED ORDER — OXYCODONE-ACETAMINOPHEN 5-325 MG PO TABS: 1.0000 | ORAL_TABLET | Freq: Four times a day (QID) | ORAL | 0 refills | Status: AC | PRN

## 2020-06-26 MED ORDER — ASPIRIN 325 MG PO TABS: 325.0000 mg | ORAL_TABLET | Freq: Two times a day (BID) | ORAL | 0 refills | Status: AC

## 2020-06-26 NOTE — Progress Notes (Signed)
Orthopedic Tech Progress Note Patient Details:  Ricky Hernandez [birth year] [id number] Called in order to [facility] Patient ID: Ricky Hernandez, male   DOB: [birth year], 60 y.o.   MRN: [id number]   [name] 06/26/2020, 1:39 PM

## 2020-06-26 NOTE — Evaluation (Signed)
Occupational Therapy Evaluation Patient Details Name: Ricky Hernandez MRN: [id number] DOB: [birth year] Today's Date: 06/26/2020    History of Present Illness Patient is a 60 y/o male with no significant PMH presented to ED after mechanical fall on 1/8. Patient sustained L quadriceps tendon rupture and underwent L quad tendon repair on 1/11.   Clinical Impression   Pt is Mod I with functional mobility using RW, Sup with ADLs/selfcare. PTA, pt's mother and nephew lives with him and he was Ind and working. All education completed and no further acute services are indicated at this time. OT will sign off    Follow Up Recommendations  No OT follow up;Supervision - Intermittent    Equipment Recommendations  None recommended by OT    Recommendations for Other Services       Precautions / Restrictions Precautions Precautions: Fall Required Braces or Orthoses: Knee Immobilizer - Left Knee Immobilizer - Left: On at all times Restrictions Weight Bearing Restrictions: Yes LLE Weight Bearing: Weight bearing as tolerated Other Position/Activity Restrictions: WBAT in full knee extension      Mobility Bed Mobility Overal bed mobility: Modified Independent                  Transfers Overall transfer level: Modified independent Equipment used: Rolling walker (2 wheeled)                  Balance Overall balance assessment: No apparent balance deficits (not formally assessed)                                         ADL either performed or assessed with clinical judgement   ADL Overall ADL's : Needs assistance/impaired Eating/Feeding: Independent   Grooming: Wash/dry hands;Wash/dry face;Supervision/safety;Standing   Upper Body Bathing: Set up;Modified independent;Sitting   Lower Body Bathing: Supervison/ safety;Sitting/lateral leans;Sit to/from stand   Upper Body Dressing : Set up;Modified independent;Sitting   Lower Body Dressing:  Supervision/safety;Sitting/lateral leans;Sit to/from stand   Toilet Transfer: Modified Independent;Ambulation;RW   Toileting- Clothing Manipulation and Hygiene: Supervision/safety;Sit to/from stand       Functional mobility during ADLs: Modified independent;Rolling walker General ADL Comments: Pt educated on LB bathing and dressing techniques     Vision Patient Visual Report: No change from baseline       Perception     Praxis      Pertinent Vitals/Pain Pain Assessment: No/denies pain     Hand Dominance Right   Extremity/Trunk Assessment Upper Extremity Assessment Upper Extremity Assessment: Overall WFL for tasks assessed   Lower Extremity Assessment Lower Extremity Assessment: Defer to PT evaluation LLE Deficits / Details: L LE grossly 3+/5, able to perform SLR against gravity in KI LLE: Unable to fully assess due to immobilization       Communication Communication Communication: No difficulties   Cognition Arousal/Alertness: Awake/alert Behavior During Therapy: Flat affect Overall Cognitive Status: Within Functional Limits for tasks assessed                                     General Comments       Exercises     Shoulder Instructions      Home Living Family/patient expects to be discharged to:: Private residence Living Arrangements: Parent;Other relatives Available Help at Discharge: Family;Available 24 hours/day Type of Home: House Home Access: Stairs to  enter Entrance Stairs-Number of Steps: 3 Entrance Stairs-Rails: Right;Left;Can reach both Home Layout: One level     Bathroom Shower/Tub: Producer, television/film/video: Standard     Home Equipment: None          Prior Functioning/Environment Level of Independence: Independent        Comments: works at [employer]        OT Problem List: Decreased activity tolerance;Impaired balance (sitting and/or standing)      OT Treatment/Interventions:       OT Goals(Current goals can be found in the care plan section) Acute Rehab OT Goals Patient Stated Goal: to go home OT Goal Formulation: With patient  OT Frequency:     Barriers to D/C:            Co-evaluation              AM-PAC OT "6 Clicks" Daily Activity     Outcome Measure Help from another person eating meals?: None Help from another person taking care of personal grooming?: None Help from another person toileting, which includes using toliet, bedpan, or urinal?: None Help from another person bathing (including washing, rinsing, drying)?: None Help from another person to put on and taking off regular upper body clothing?: None Help from another person to put on and taking off regular lower body clothing?: None 6 Click Score: 24   End of Session Equipment Utilized During Treatment: Rolling walker  Activity Tolerance: Patient tolerated treatment well Patient left: in bed;with call bell/phone within reach  OT Visit Diagnosis: Unsteadiness on feet (R26.81);History of falling (Z91.81)                Time: 7026-3785 OT Time Calculation (min): 19 min Charges:  OT General Charges $OT Visit: 1 Visit OT Evaluation $OT Eval Low Complexity: 1 Low    Galen Manila 06/26/2020, 10:22 AM

## 2020-06-26 NOTE — Discharge Instructions (Signed)
Orthopaedic Trauma Service Discharge Instructions   General Discharge Instructions  Orthopaedic Injuries:  Left quadriceps rupture  WEIGHT BEARING STATUS: weightbear as tolerated with knee in full extension (all the way straight). Wear brace all they time except for hygiene. Brace must be locked in full extension   RANGE OF MOTION/ACTIVITY: no knee motion a this time. Ok to move ankle   Bone health:  Labs show vitamin d deficiency. Use both supplements that were prescribed   Wound Care: daily wound care starting on 06/28/2020. See below  Discharge Wound Care Instructions  Do NOT apply any ointments, solutions or lotions to pin sites or surgical wounds.  These prevent needed drainage and even though solutions like hydrogen peroxide kill bacteria, they also damage cells lining the pin sites that help fight infection.  Applying lotions or ointments can keep the wounds moist and can cause them to breakdown and open up as well. This can increase the risk for infection. When in doubt call the office.  Surgical incisions should be dressed daily.  If any drainage is noted, use one layer of adaptic, then gauze, Kerlix, and an ace wrap.  Once the incision is completely dry and without drainage, it may be left open to air out.  Showering may begin 36-48 hours later.  Cleaning gently with soap and water.  Traumatic wounds should be dressed daily as well.    One layer of adaptic, gauze, Kerlix, then ace wrap.  The adaptic can be discontinued once the draining has ceased    If you have a wet to dry dressing: wet the gauze with saline the squeeze as much saline out so the gauze is moist (not soaking wet), place moistened gauze over wound, then place a dry gauze over the moist one, followed by Kerlix wrap, then ace wrap  DVT/PE prophylaxis: aspirin 325 mg daily x 4 weeks   Diet: as you were eating previously.  Can use over the counter stool softeners and bowel preparations, such as Miralax, to  help with bowel movements.  Narcotics can be constipating.  Be sure to drink plenty of fluids  PAIN MEDICATION USE AND EXPECTATIONS  You have likely been given narcotic medications to help control your pain.  After a traumatic event that results in an fracture (broken bone) with or without surgery, it is ok to use narcotic pain medications to help control one's pain.  We understand that everyone responds to pain differently and each individual patient will be evaluated on a regular basis for the continued need for narcotic medications. Ideally, narcotic medication use should last no more than 6-8 weeks (coinciding with fracture healing).   As a patient it is your responsibility as well to monitor narcotic medication use and report the amount and frequency you use these medications when you come to your office visit.   We would also advise that if you are using narcotic medications, you should take a dose prior to therapy to maximize you participation.  IF YOU ARE ON NARCOTIC MEDICATIONS IT IS NOT PERMISSIBLE TO OPERATE A MOTOR VEHICLE (MOTORCYCLE/CAR/TRUCK/MOPED) OR HEAVY MACHINERY DO NOT MIX NARCOTICS WITH OTHER CNS (CENTRAL NERVOUS SYSTEM) DEPRESSANTS SUCH AS ALCOHOL   STOP SMOKING OR USING NICOTINE PRODUCTS!!!!  As discussed nicotine severely impairs your body's ability to heal surgical and traumatic wounds but also impairs bone healing.  Wounds and bone heal by forming microscopic blood vessels (angiogenesis) and nicotine is a vasoconstrictor (essentially, shrinks blood vessels).  Therefore, if vasoconstriction occurs to these  microscopic blood vessels they essentially disappear and are unable to deliver necessary nutrients to the healing tissue.  This is one modifiable factor that you can do to dramatically increase your chances of healing your injury.    (This means no smoking, no nicotine gum, patches, etc)  DO NOT USE NONSTEROIDAL ANTI-INFLAMMATORY DRUGS (NSAID'S)  Using products such as  Advil (ibuprofen), Aleve (naproxen), Motrin (ibuprofen) for additional pain control during fracture healing can delay and/or prevent the healing response.  If you would like to take over the counter (OTC) medication, Tylenol (acetaminophen) is ok.  However, some narcotic medications that are given for pain control contain acetaminophen as well. Therefore, you should not exceed more than 4000 mg of tylenol in a day if you do not have liver disease.  Also note that there are may OTC medicines, such as cold medicines and allergy medicines that my contain tylenol as well.  If you have any questions about medications and/or interactions please ask your doctor/PA or your pharmacist.      ICE AND ELEVATE INJURED/OPERATIVE EXTREMITY  Using ice and elevating the injured extremity above your heart can help with swelling and pain control.  Icing in a pulsatile fashion, such as 20 minutes on and 20 minutes off, can be followed.    Do not place ice directly on skin. Make sure there is a barrier between to skin and the ice pack.    Using frozen items such as frozen peas works well as the conform nicely to the are that needs to be iced.  USE AN ACE WRAP OR TED HOSE FOR SWELLING CONTROL  In addition to icing and elevation, Ace wraps or TED hose are used to help limit and resolve swelling.  It is recommended to use Ace wraps or TED hose until you are informed to stop.    When using Ace Wraps start the wrapping distally (farthest away from the body) and wrap proximally (closer to the body)   Example: If you had surgery on your leg or thing and you do not have a splint on, start the ace wrap at the toes and work your way up to the thigh        If you had surgery on your upper extremity and do not have a splint on, start the ace wrap at your fingers and work your way up to the upper arm  IF YOU ARE IN A SPLINT OR CAST DO NOT REMOVE IT FOR ANY REASON   If your splint gets wet for any reason please contact the office  immediately. You may shower in your splint or cast as long as you keep it dry.  This can be done by wrapping in a cast cover or garbage back (or similar)  Do Not stick any thing down your splint or cast such as pencils, money, or hangers to try and scratch yourself with.  If you feel itchy take benadryl as prescribed on the bottle for itching  IF YOU ARE IN A CAM BOOT (BLACK BOOT)  You may remove boot periodically. Perform daily dressing changes as noted below.  Wash the liner of the boot regularly and wear a sock when wearing the boot. It is recommended that you sleep in the boot until told otherwise    Call office for the following:  Temperature greater than 101F  Persistent nausea and vomiting  Severe uncontrolled pain  Redness, tenderness, or signs of infection (pain, swelling, redness, odor or green/yellow discharge around the site)  Difficulty breathing, headache or visual disturbances  Hives  Persistent dizziness or light-headedness  Extreme fatigue  Any other questions or concerns you may have after discharge  In an emergency, call 911 or go to an Emergency Department at a nearby hospital  HELPFUL INFORMATION  ? If you had a block, it will wear off between 8-24 hrs postop typically.  This is period when your pain may go from nearly zero to the pain you would have had postop without the block.  This is an abrupt transition but nothing dangerous is happening.  You may take an extra dose of narcotic when this happens.  ? You should wean off your narcotic medicines as soon as you are able.  Most patients will be off or using minimal narcotics before their first postop appointment.   ? We suggest you use the pain medication the first night prior to going to bed, in order to ease any pain when the anesthesia wears off. You should avoid taking pain medications on an empty stomach as it will make you nauseous.  ? Do not drink alcoholic beverages or take illicit drugs when taking  pain medications.  ? In most states it is against the law to drive while you are in a splint or sling.  And certainly against the law to drive while taking narcotics.  ? You may return to work/school in the next couple of days when you feel up to it.   ? Pain medication may make you constipated.  Below are a few solutions to try in this order: - Decrease the amount of pain medication if you arent having pain. - Drink lots of decaffeinated fluids. - Drink prune juice and/or each dried prunes  o If the first 3 dont work start with additional solutions - Take Colace - an over-the-counter stool softener - Take Senokot - an over-the-counter laxative - Take Miralax - a stronger over-the-counter laxative     CALL THE OFFICE WITH ANY QUESTIONS OR CONCERNS: [phone]   VISIT OUR WEBSITE FOR ADDITIONAL INFORMATION: [email or web address]

## 2020-06-26 NOTE — Progress Notes (Signed)
Ricky Hernandez to be discharged home per MD order.  Discussed prescriptions and follow up appointments with the patient. Prescriptions sent patient's preferred pharmacy, medication list explained in detail. Pt verbalized understanding.  Allergies as of 06/26/2020   No Known Allergies     Medication List    TAKE these medications   acetaminophen 325 MG tablet Commonly known as: TYLENOL Take 1.5 tablets (487.5 mg total) by mouth every 12 (twelve) hours.   ascorbic acid 1000 MG tablet Commonly known as: VITAMIN C Take 1 tablet (1,000 mg total) by mouth daily.   aspirin 325 MG tablet Take 1 tablet (325 mg total) by mouth 2 (two) times daily.   methocarbamol 500 MG tablet Commonly known as: ROBAXIN Take 1-2 tablets (500-1,000 mg total) by mouth every 6 (six) hours as needed for muscle spasms.   oxyCODONE-acetaminophen 5-325 MG tablet Commonly known as: Percocet Take 1-2 tablets by mouth every 6 (six) hours as needed for severe pain.   Vitamin D (Ergocalciferol) 1.25 MG (50000 UNIT) Caps capsule Commonly known as: DRISDOL Take 1 capsule (50,000 Units total) by mouth every 7 (seven) days. Start taking on: July 03, 2020   Vitamin D 125 MCG (5000 UT) Caps Take 1 capsule by mouth daily.            Durable Medical Equipment  (From admission, onward)         Start     Ordered   06/26/20 1128  For home use only DME Walker rolling  Once       Question Answer Comment  Walker: With 5 Inch Wheels   Patient needs a walker to treat with the following condition Weakness      06/26/20 1128           Discharge Care Instructions  (From admission, onward)         Start     Ordered   06/26/20 0000  Weight bearing as tolerated       Comments: With knee in full extension and brace on  Question Answer Comment  Laterality left   Extremity Lower      06/26/20 1300          Vitals:   06/25/20 1924 06/26/20 0811  BP: (!) 145/91 (!) 169/94  Pulse: 85 68  Resp: 17 18   Temp: 98.4 F (36.9 C) 98.2 F (36.8 C)  SpO2: 95% 98%    IV catheter discontinued intact. Site without signs and symptoms of complications. Dressing and pressure applied. Pt denies pain at this time. No complaints noted. Rolling walker delivered to room for pt to take home, hanger brace also delivered and hanger tech applied to patient prior to leaving.  An After Visit Summary was printed and given to the patient. Patient escorted via Wheelchair, and Discharged home via private auto with family member.  Johnette Abraham [facility] 06/26/2020 2:00pm

## 2020-06-26 NOTE — Plan of Care (Signed)
  Problem: Education: Goal: Knowledge of General Education information will improve Description: Including pain rating scale, medication(s)/side effects and non-pharmacologic comfort measures 06/26/2020 1436 by Lajean Saver, RN Outcome: Adequate for Discharge 06/26/2020 1436 by Lajean Saver, RN Outcome: Adequate for Discharge   Problem: Health Behavior/Discharge Planning: Goal: Ability to manage health-related needs will improve 06/26/2020 1436 by Lajean Saver, RN Outcome: Adequate for Discharge 06/26/2020 1436 by Lajean Saver, RN Outcome: Adequate for Discharge   Problem: Clinical Measurements: Goal: Ability to maintain clinical measurements within normal limits will improve 06/26/2020 1436 by Lajean Saver, RN Outcome: Adequate for Discharge 06/26/2020 1436 by Lajean Saver, RN Outcome: Adequate for Discharge Goal: Will remain free from infection 06/26/2020 1436 by Lajean Saver, RN Outcome: Adequate for Discharge 06/26/2020 1436 by Lajean Saver, RN Outcome: Adequate for Discharge Goal: Diagnostic test results will improve 06/26/2020 1436 by Lajean Saver, RN Outcome: Adequate for Discharge 06/26/2020 1436 by Lajean Saver, RN Outcome: Adequate for Discharge Goal: Respiratory complications will improve 06/26/2020 1436 by Lajean Saver, RN Outcome: Adequate for Discharge 06/26/2020 1436 by Lajean Saver, RN Outcome: Adequate for Discharge Goal: Cardiovascular complication will be avoided 06/26/2020 1436 by Lajean Saver, RN Outcome: Adequate for Discharge 06/26/2020 1436 by Lajean Saver, RN Outcome: Adequate for Discharge   Problem: Activity: Goal: Risk for activity intolerance will decrease 06/26/2020 1436 by Lajean Saver, RN Outcome: Adequate for Discharge 06/26/2020 1436 by Lajean Saver, RN Outcome: Adequate for Discharge   Problem: Nutrition: Goal: Adequate nutrition will be maintained 06/26/2020 1436 by  Lajean Saver, RN Outcome: Adequate for Discharge 06/26/2020 1436 by Lajean Saver, RN Outcome: Adequate for Discharge   Problem: Coping: Goal: Level of anxiety will decrease 06/26/2020 1436 by Lajean Saver, RN Outcome: Adequate for Discharge 06/26/2020 1436 by Lajean Saver, RN Outcome: Adequate for Discharge   Problem: Elimination: Goal: Will not experience complications related to bowel motility 06/26/2020 1436 by Lajean Saver, RN Outcome: Adequate for Discharge 06/26/2020 1436 by Lajean Saver, RN Outcome: Adequate for Discharge Goal: Will not experience complications related to urinary retention 06/26/2020 1436 by Lajean Saver, RN Outcome: Adequate for Discharge 06/26/2020 1436 by Lajean Saver, RN Outcome: Adequate for Discharge   Problem: Pain Managment: Goal: General experience of comfort will improve 06/26/2020 1436 by Lajean Saver, RN Outcome: Adequate for Discharge 06/26/2020 1436 by Lajean Saver, RN Outcome: Adequate for Discharge   Problem: Safety: Goal: Ability to remain free from injury will improve 06/26/2020 1436 by Lajean Saver, RN Outcome: Adequate for Discharge 06/26/2020 1436 by Lajean Saver, RN Outcome: Adequate for Discharge   Problem: Skin Integrity: Goal: Risk for impaired skin integrity will decrease 06/26/2020 1436 by Lajean Saver, RN Outcome: Adequate for Discharge 06/26/2020 1436 by Lajean Saver, RN Outcome: Adequate for Discharge

## 2020-06-26 NOTE — TOC Transition Note (Signed)
Transition of Care [facility]) - CM/SW Discharge Note   Patient Details  Name: Ricky Hernandez MRN: [id number] Date of Birth: [birth year]  [facility] CM/SW Contact:  Epifanio Lesches, RN Phone Number: 06/26/2020, 11:39 AM   Clinical Narrative:     Patient will DC to: home Anticipated DC date: 06/27/2019 Family notified: yes, [facility] by: car     -  S/p L quad tendon repair on 1/11  Per MD patient ready for DC today. From home with nephew [place]. Pt states prior to admit independent with ADL's, no DME usage. Pt without PCP, no health insurance. 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 NCM shared [facility]. Pt expressed interest. Post hospital f/u (establish primary care) arranged and noted on AVS.   RN, patient, and patient's family made aware of potential d/c for today. Pt without Rx med concerns or affordability issues.  RNCM will sign off for now as intervention is no longer needed. Please consult [place] again if new needs arise.   Final next level of care: Home/Self Care Barriers to Discharge: No Barriers Identified   Patient Goals and CMS Choice Patient states their goals for this hospitalization and ongoing recovery are:: TO GO HOME AND GET BETTER   Choice offered to / list presented to : Patient  Discharge Placement                       Discharge Plan and Services                DME Arranged: Walker rolling DME Agency: [employer] Date DME Agency Contacted: 06/26/20 Time DME Agency Contacted: [phone] Representative spoke with at DME Agency: nurse to give from floor stock            Social Determinants of Health (SDOH) Interventions     Readmission Risk Interventions No flowsheet data found.

## 2020-06-26 NOTE — Progress Notes (Signed)
Patient ID: Ricky Hernandez, male   DOB: 1960-12-24, 60 y.o.   MRN: [id number]   LOS: 0 days   Subjective: Doing well, ready for discharge   Objective: Vital signs in last 24 hours: Temp:  [97.5 F (36.4 C)-98.6 F (37 C)] 98.2 F (36.8 C) (01/12 0811) Pulse Rate:  [60-85] 68 (01/12 0811) Resp:  [12-18] 18 (01/12 0811) BP: (145-184)/(88-105) 169/94 (01/12 0811) SpO2:  [91 %-100 %] 98 % (01/12 0811)     Laboratory  CBC Recent Labs    06/25/20 0300  WBC 6.7  HGB 14.4  HCT 43.8  PLT 279   BMET Recent Labs    06/25/20 0300  NA 137  K 4.6  CL 102  CO2 22  GLUCOSE 97  BUN 19  CREATININE 0.77  CALCIUM 9.2     Physical Exam General appearance: alert and no distress  RLE: KI in place, no sig swelling, toes perfused, able to move toes   Assessment/Plan: S/p quad tendon repair -- Ok for discharge after PT.    Freeman Caldron, PA-C Orthopedic Surgery [phone] 06/26/2020

## 2020-06-26 NOTE — Discharge Summary (Signed)
Orthopaedic Trauma Service (OTS) Discharge Summary   Patient ID: Ricky Severinony Rochelle MRN: [id number] DOB/AGE: 62/06/1960 62 y.o.  Admit date: 06/24/2020 Discharge date: 06/26/2020  Admission Diagnoses: Left quadriceps tendon rupture Hypertension  Discharge Diagnoses:  Principal Problem:   Rupture of left quadriceps muscle Active Problems:   Hypertension   Past Medical History:  Diagnosis Date  . Hypertension      Procedures Performed: 06/25/2020-Dr. Carola FrostHandy Repair of left quadriceps tendon.   Discharged Condition: good  Hospital Course:   Patient is 60 year old male who sustained a fall off of his porch on 06/22/2020.  Had increasing pain and presented to the emergency department late in the evening on 06/24/2020.  Work-up demonstrated left quadriceps tendon tear.  Patient was admitted from the ED for surgery on 06/25/2020.  Patient taken to the operating room on the day noted above.  Procedure above was performed.  Patient tolerated procedure well.  After surgery he was transferred to PACU for recovery from anesthesia and then back up to the orthopedic floor for observation pain control and therapies.  Patient did not have any issues overnight and mobilized well with therapy.  He was fitted for a hinged knee brace which was locked in full extension given his repair.  He is permitted weightbearing as tolerated in full extension.  He will be restricted from any knee range of motion for 2 weeks and will be restricted from active knee extension for 8 weeks.  He will begin strengthening at around the 8-week mark as well.  Patient was discharged in stable condition on 06/26/2020.  He will be discharged on aspirin for DVT and PE prophylaxis.  He was covered with Lovenox during his inpatient stay.  He did receive appropriate antibiotics for perioperative treatment.  Additionally routine lab work-up did demonstrate vitamin D deficiency.  He was started on supplementation and prescriptions were  given at discharge as well  Consults: None  Significant Diagnostic Studies: labs:   Results for Ricky Hernandez, Ricky Hernandez (MRN [id number]) as of 07/11/2020 10:46  Ref. Range 06/26/2020 02:17  Vitamin D, 25-Hydroxy Latest Ref Range: 30 - 100 ng/mL 10.21 (L)   Imaging  MRI left knee  IMPRESSION: 1. Near complete rupture of the quadriceps tendon. There are some intact fibers of the vastus lateralis. 2. Intact ligamentous structures and no meniscal tears. 3. Moderate-sized joint effusion and small leaking Baker's cyst.    Treatments: IV hydration, antibiotics: Ancef, analgesia: acetaminophen, Dilaudid and oxycodone, anticoagulation: Lovenox during inpatient stay and aspirin at discharge, therapies: PT, OT and RN and surgery: As above  Discharge Exam:  Subjective: Doing well, ready for discharge   Objective: Vital signs in last 24 hours: Temp:  [97.5 F (36.4 C)-98.6 F (37 C)] 98.2 F (36.8 C) (01/12 0811) Pulse Rate:  [60-85] 68 (01/12 0811) Resp:  [12-18] 18 (01/12 0811) BP: (145-184)/(88-105) 169/94 (01/12 0811) SpO2:  [91 %-100 %] 98 % (01/12 0811)   Laboratory  CBC Recent Labs (last 2 labs)      Recent Labs    06/25/20 0300  WBC 6.7  HGB 14.4  HCT 43.8  PLT 279     BMET Recent Labs (last 2 labs)      Recent Labs    06/25/20 0300  NA 137  K 4.6  CL 102  CO2 22  GLUCOSE 97  BUN 19  CREATININE 0.77  CALCIUM 9.2       Physical Exam General appearance: alert and no distress  RLE: KI in place, no sig  swelling, toes perfused, able to move toes   Assessment/Plan: S/p quad tendon repair -- Ok for discharge after PT.     Disposition: Discharge disposition: 01-Home or Self Care       Discharge Instructions    Ambulatory referral to Physical Therapy   Complete by: As directed    Call MD / Call 911   Complete by: As directed    If you experience chest pain or shortness of breath, CALL 911 and be transported to the hospital emergency  room.  If you develope a fever above 101 F, pus (white drainage) or increased drainage or redness at the wound, or calf pain, call your surgeon's office.   Constipation Prevention   Complete by: As directed    Drink plenty of fluids.  Prune juice may be helpful.  You may use a stool softener, such as Colace (over the counter) 100 mg twice a day.  Use MiraLax (over the counter) for constipation as needed.   Diet - low sodium heart healthy   Complete by: As directed    Discharge instructions   Complete by: As directed    Orthopaedic Trauma Service Discharge Instructions   General Discharge Instructions  Orthopaedic Injuries:  Left quadriceps rupture  WEIGHT BEARING STATUS: weightbear as tolerated with knee in full extension (all the way straight). Wear brace all they time except for hygiene. Brace must be locked in full extension   RANGE OF MOTION/ACTIVITY: no knee motion a this time. Ok to move ankle   Bone health:  Labs show vitamin d deficiency. Use both supplements that were prescribed   Wound Care: daily wound care starting on 06/28/2020. See below  Discharge Wound Care Instructions  Do NOT apply any ointments, solutions or lotions to pin sites or surgical wounds.  These prevent needed drainage and even though solutions like hydrogen peroxide kill bacteria, they also damage cells lining the pin sites that help fight infection.  Applying lotions or ointments can keep the wounds moist and can cause them to breakdown and open up as well. This can increase the risk for infection. When in doubt call the office.  Surgical incisions should be dressed daily.  If any drainage is noted, use one layer of adaptic, then gauze, Kerlix, and an ace wrap.  Once the incision is completely dry and without drainage, it may be left open to air out.  Showering may begin 36-48 hours later.  Cleaning gently with soap and water.  Traumatic wounds should be dressed daily as well.    One layer of adaptic,  gauze, Kerlix, then ace wrap.  The adaptic can be discontinued once the draining has ceased    If you have a wet to dry dressing: wet the gauze with saline the squeeze as much saline out so the gauze is moist (not soaking wet), place moistened gauze over wound, then place a dry gauze over the moist one, followed by Kerlix wrap, then ace wrap  DVT/PE prophylaxis: aspirin 325 mg daily x 4 weeks   Diet: as you were eating previously.  Can use over the counter stool softeners and bowel preparations, such as Miralax, to help with bowel movements.  Narcotics can be constipating.  Be sure to drink plenty of fluids  PAIN MEDICATION USE AND EXPECTATIONS  You have likely been given narcotic medications to help control your pain.  After a traumatic event that results in an fracture (broken bone) with or without surgery, it is ok to use narcotic pain  medications to help control one's pain.  We understand that everyone responds to pain differently and each individual patient will be evaluated on a regular basis for the continued need for narcotic medications. Ideally, narcotic medication use should last no more than 6-8 weeks (coinciding with fracture healing).   As a patient it is your responsibility as well to monitor narcotic medication use and report the amount and frequency you use these medications when you come to your office visit.   We would also advise that if you are using narcotic medications, you should take a dose prior to therapy to maximize you participation.  IF YOU ARE ON NARCOTIC MEDICATIONS IT IS NOT PERMISSIBLE TO OPERATE A MOTOR VEHICLE (MOTORCYCLE/CAR/TRUCK/MOPED) OR HEAVY MACHINERY DO NOT MIX NARCOTICS WITH OTHER CNS (CENTRAL NERVOUS SYSTEM) DEPRESSANTS SUCH AS ALCOHOL   STOP SMOKING OR USING NICOTINE PRODUCTS!!!!  As discussed nicotine severely impairs your body's ability to heal surgical and traumatic wounds but also impairs bone healing.  Wounds and bone heal by forming microscopic  blood vessels (angiogenesis) and nicotine is a vasoconstrictor (essentially, shrinks blood vessels).  Therefore, if vasoconstriction occurs to these microscopic blood vessels they essentially disappear and are unable to deliver necessary nutrients to the healing tissue.  This is one modifiable factor that you can do to dramatically increase your chances of healing your injury.    (This means no smoking, no nicotine gum, patches, etc)  DO NOT USE NONSTEROIDAL ANTI-INFLAMMATORY DRUGS (NSAID'S)  Using products such as Advil (ibuprofen), Aleve (naproxen), Motrin (ibuprofen) for additional pain control during fracture healing can delay and/or prevent the healing response.  If you would like to take over the counter (OTC) medication, Tylenol (acetaminophen) is ok.  However, some narcotic medications that are given for pain control contain acetaminophen as well. Therefore, you should not exceed more than 4000 mg of tylenol in a day if you do not have liver disease.  Also note that there are may OTC medicines, such as cold medicines and allergy medicines that my contain tylenol as well.  If you have any questions about medications and/or interactions please ask your doctor/PA or your pharmacist.      ICE AND ELEVATE INJURED/OPERATIVE EXTREMITY  Using ice and elevating the injured extremity above your heart can help with swelling and pain control.  Icing in a pulsatile fashion, such as 20 minutes on and 20 minutes off, can be followed.    Do not place ice directly on skin. Make sure there is a barrier between to skin and the ice pack.    Using frozen items such as frozen peas works well as the conform nicely to the are that needs to be iced.  USE AN ACE WRAP OR TED HOSE FOR SWELLING CONTROL  In addition to icing and elevation, Ace wraps or TED hose are used to help limit and resolve swelling.  It is recommended to use Ace wraps or TED hose until you are informed to stop.    When using Ace Wraps start the  wrapping distally (farthest away from the body) and wrap proximally (closer to the body)   Example: If you had surgery on your leg or thing and you do not have a splint on, start the ace wrap at the toes and work your way up to the thigh        If you had surgery on your upper extremity and do not have a splint on, start the ace wrap at your fingers and work your  way up to the upper arm  IF YOU ARE IN A SPLINT OR CAST DO NOT REMOVE IT FOR ANY REASON   If your splint gets wet for any reason please contact the office immediately. You may shower in your splint or cast as long as you keep it dry.  This can be done by wrapping in a cast cover or garbage back (or similar)  Do Not stick any thing down your splint or cast such as pencils, money, or hangers to try and scratch yourself with.  If you feel itchy take benadryl as prescribed on the bottle for itching  IF YOU ARE IN A CAM BOOT (BLACK BOOT)  You may remove boot periodically. Perform daily dressing changes as noted below.  Wash the liner of the boot regularly and wear a sock when wearing the boot. It is recommended that you sleep in the boot until told otherwise    Call office for the following: Temperature greater than 101F Persistent nausea and vomiting Severe uncontrolled pain Redness, tenderness, or signs of infection (pain, swelling, redness, odor or green/yellow discharge around the site) Difficulty breathing, headache or visual disturbances Hives Persistent dizziness or light-headedness Extreme fatigue Any other questions or concerns you may have after discharge  In an emergency, call 911 or go to an Emergency Department at a nearby hospital  HELPFUL INFORMATION  If you had a block, it will wear off between 8-24 hrs postop typically.  This is period when your pain may go from nearly zero to the pain you would have had postop without the block.  This is an abrupt transition but nothing dangerous is happening.  You may take an extra  dose of narcotic when this happens.  You should wean off your narcotic medicines as soon as you are able.  Most patients will be off or using minimal narcotics before their first postop appointment.   We suggest you use the pain medication the first night prior to going to bed, in order to ease any pain when the anesthesia wears off. You should avoid taking pain medications on an empty stomach as it will make you nauseous.  Do not drink alcoholic beverages or take illicit drugs when taking pain medications.  In most states it is against the law to drive while you are in a splint or sling.  And certainly against the law to drive while taking narcotics.  You may return to work/school in the next couple of days when you feel up to it.   Pain medication may make you constipated.  Below are a few solutions to try in this order: Decrease the amount of pain medication if you aren't having pain. Drink lots of decaffeinated fluids. Drink prune juice and/or each dried prunes  If the first 3 don't work start with additional solutions Take Colace - an over-the-counter stool softener Take Senokot - an over-the-counter laxative Take Miralax - a stronger over-the-counter laxative     CALL THE OFFICE WITH ANY QUESTIONS OR CONCERNS: [phone]   VISIT OUR WEBSITE FOR ADDITIONAL INFORMATION: [email or web address]   Driving restrictions   Complete by: As directed    No driving until further notice   Increase activity slowly as tolerated   Complete by: As directed    Weight bearing as tolerated   Complete by: As directed    With knee in full extension and brace on   Laterality: left   Extremity: Lower     Allergies as of 06/26/2020   No Known  Allergies     Medication List    TAKE these medications   acetaminophen 325 MG tablet Commonly known as: TYLENOL Take 1.5 tablets (487.5 mg total) by mouth every 12 (twelve) hours.   ascorbic acid 1000 MG tablet Commonly known as: VITAMIN C Take 1  tablet (1,000 mg total) by mouth daily.   aspirin 325 MG tablet Take 1 tablet (325 mg total) by mouth 2 (two) times daily.   methocarbamol 500 MG tablet Commonly known as: ROBAXIN Take 1-2 tablets (500-1,000 mg total) by mouth every 6 (six) hours as needed for muscle spasms.   oxyCODONE-acetaminophen 5-325 MG tablet Commonly known as: Percocet Take 1-2 tablets by mouth every 6 (six) hours as needed for severe pain.   Vitamin D (Ergocalciferol) 1.25 MG (50000 UNIT) Caps capsule Commonly known as: DRISDOL Take 1 capsule (50,000 Units total) by mouth every 7 (seven) days.   Vitamin D 125 MCG (5000 UT) Caps Take 1 capsule by mouth daily.            Discharge Care Instructions  (From admission, onward)         Start     Ordered   06/26/20 0000  Weight bearing as tolerated       Comments: With knee in full extension and brace on  Question Answer Comment  Laterality left   Extremity Lower      06/26/20 1300          Follow-up Information    [facility]. Go on 07/18/2020.   Why: 1:50 pm with Georgian Co PA Contact information: [address] [place] [address] [phone]       [facility] Follow up.   Specialty: Rehabilitation Why: Referral made for outpatient PT. Office will call and setup appointment time. Contact information: [address] [id number] mc [place] [address] [phone]       Myrene Galas, MD. Schedule an appointment as soon as possible for a visit in 2 week(s).   Specialty: Orthopedic Surgery Contact information: [address] [place] [address] [phone]               Discharge Instructions and Plan:  60 year old male traumatic left quadriceps tendon rupture status post repair  Weightbearing: WBAT Left leg with hinged knee brace locked in full extension Insicional and dressing care: Daily dressing  changes with Adaptic, 4 x 4 gauze, Kerlix and Ace wrap Orthopedic device(s): Crutches and hinged knee brace locked in full extension Showering: Okay to shower once wounds are dry VTE prophylaxis: Aspirin 325mg  BID 4 weeks Pain control: Multimodal with Tylenol, Percocet and Robaxin Bone Health/Optimization: Lab work-up shows vitamin D deficiency.  Patient has been prescribed vitamin D2 and vitamin D3.  Recheck in 8-12 Follow - up plan: 2 weeks Contact information: 10-12 MD, Myrene Galas PA-C   Signed:  Montez Morita, PA-C [phone] (C) 07/11/2020, 10:43 AM  [facility] [address] [place] [phone] (F)

## 2020-06-26 NOTE — Evaluation (Signed)
Physical Therapy Evaluation & Discharge Patient Details Name: Ricky Hernandez MRN: [id number] DOB: 03/20/1961 Today's Date: 06/26/2020   History of Present Illness  Patient is a 60 y/o male with no significant PMH presented to ED after mechanical fall on 1/8. Patient sustained L quadriceps tendon rupture and underwent L quad tendon repair on 1/11.  Clinical Impression  PTA, patient lives with mother and nephew and independent. Patient overall modI for mobility with RW. Patient ambulated 100' with RW and negotiated 3 stairs with B handrails and step to pattern. Patient does not require skilled PT services during acute stay. Recommend OPPT following discharge to maximize functional independence and return to PLOF.     Follow Up Recommendations Outpatient PT    Equipment Recommendations  Rolling [name] with 5" wheels    Recommendations for Other Services       Precautions / Restrictions Precautions Precautions: Fall Required Braces or Orthoses: Knee Immobilizer - Left Knee Immobilizer - Left: On at all times Restrictions Weight Bearing Restrictions: Yes LLE Weight Bearing: Weight bearing as tolerated Other Position/Activity Restrictions: WBAT in full knee extension      Mobility  Bed Mobility Overal bed mobility: Modified Independent                  Transfers Overall transfer level: Modified independent Equipment used: Rolling [name] (2 wheeled)                Ambulation/Gait Ambulation/Gait assistance: Modified independent (Device/Increase time) Gait Distance (Feet): 100 Feet Assistive device: Rolling [name] (2 wheeled) Gait Pattern/deviations: Step-to pattern;Decreased stride length Gait velocity: decreased   General Gait Details: instructed patient on use of RW, cues for maintaining close proximity to RW initially  Stairs Stairs: Yes Stairs assistance: Modified independent (Device/Increase time) Stair Management: Two rails;Step to pattern;Forwards Number  of Stairs: 3    Wheelchair Mobility    Modified Rankin (Stroke Patients Only)       Balance Overall balance assessment: No apparent balance deficits (not formally assessed)                                           Pertinent Vitals/Pain Pain Assessment: No/denies pain    Home Living Family/patient expects to be discharged to:: Private residence Living Arrangements: Parent;Other relatives (nephew) Available Help at Discharge: Family;Available 24 hours/day Type of Home: House Home Access: Stairs to enter Entrance Stairs-Rails: Right;Left;Can reach both Entrance Stairs-Number of Steps: 3 Home Layout: One level Home Equipment: None      Prior Function Level of Independence: Independent         Comments: work at [employer] and [place] [employer]        Extremity/Trunk Assessment   Upper Extremity Assessment Upper Extremity Assessment: Defer to OT evaluation    Lower Extremity Assessment Lower Extremity Assessment: LLE deficits/detail LLE Deficits / Details: L LE grossly 3+/5, able to perform SLR against gravity in KI LLE: Unable to fully assess due to immobilization       Communication   Communication: No difficulties  Cognition Arousal/Alertness: Awake/alert Behavior During Therapy: Flat affect Overall Cognitive Status: Within Functional Limits for tasks assessed  General Comments      Exercises     Assessment/Plan    PT Assessment Patent does not need any further PT services  PT Problem List         PT Treatment Interventions      PT Goals (Current goals can be found in the Care Plan section)  Acute Rehab PT Goals Patient Stated Goal: to go home PT Goal Formulation: With patient Potential to Achieve Goals: Good    Frequency     Barriers to discharge        Co-evaluation               AM-PAC PT "6 Clicks" Mobility  Outcome Measure  Help needed turning from your back to your side while in a flat bed without using bedrails?: None Help needed moving from lying on your back to sitting on the side of a flat bed without using bedrails?: None Help needed moving to and from a bed to a chair (including a wheelchair)?: None Help needed standing up from a chair using your arms (e.g., wheelchair or bedside chair)?: None Help needed to walk in hospital room?: None Help needed climbing 3-5 steps with a railing? : None 6 Click Score: 24    End of Session Equipment Utilized During Treatment: Left knee immobilizer Activity Tolerance: Patient tolerated treatment well Patient left: in bed;with call bell/phone within reach;with bed alarm set Nurse Communication: Mobility status PT Visit Diagnosis: Unsteadiness on feet (R26.81);Muscle weakness (generalized) (M62.81);History of falling (Z91.81)    Time: 1025-8527 PT Time Calculation (min) (ACUTE ONLY): 29 min   Charges:   PT Evaluation $PT Eval Low Complexity: 1 Low          Dasia Guerrier A. Dan Humphreys PT, DPT Acute Rehabilitation Services Pager [phone] Office [phone]   Elissa Lovett 06/26/2020, 9:06 AM

## 2020-06-27 NOTE — Anesthesia Postprocedure Evaluation (Signed)
Anesthesia Post Note  Patient: Ricky Hernandez  Procedure(s) Performed: REPAIR QUADRICEP TENDON (Left Thigh)     Patient location during evaluation: PACU Anesthesia Type: General Level of consciousness: awake and alert Pain management: pain level controlled Vital Signs Assessment: post-procedure vital signs reviewed and stable Respiratory status: spontaneous breathing, nonlabored ventilation, respiratory function stable and patient connected to nasal cannula oxygen Cardiovascular status: blood pressure returned to baseline and stable Postop Assessment: no apparent nausea or vomiting Anesthetic complications: no   No complications documented.  Last Vitals:  Vitals:   06/25/20 1924 06/26/20 0811  BP: (!) 145/91 (!) 169/94  Pulse: 85 68  Resp: 17 18  Temp: 36.9 C 36.8 C  SpO2: 95% 98%    Last Pain:  Vitals:   06/26/20 1025  TempSrc:   PainSc: 0-No pain                 Geraldo Haris S

## 2020-07-02 ENCOUNTER — Ambulatory Visit: Payer: Self-pay | Attending: Orthopedic Surgery | Admitting: Physical Therapy

## 2020-07-02 ENCOUNTER — Telehealth: Payer: Self-pay | Admitting: Physical Therapy

## 2020-07-02 DIAGNOSIS — R600 Localized edema: Secondary | ICD-10-CM | POA: Insufficient documentation

## 2020-07-02 DIAGNOSIS — R2689 Other abnormalities of gait and mobility: Secondary | ICD-10-CM | POA: Insufficient documentation

## 2020-07-02 DIAGNOSIS — M25562 Pain in left knee: Secondary | ICD-10-CM | POA: Insufficient documentation

## 2020-07-02 DIAGNOSIS — R262 Difficulty in walking, not elsewhere classified: Secondary | ICD-10-CM | POA: Insufficient documentation

## 2020-07-02 DIAGNOSIS — M25662 Stiffness of left knee, not elsewhere classified: Secondary | ICD-10-CM | POA: Insufficient documentation

## 2020-07-02 NOTE — Telephone Encounter (Signed)
Ricky Hernandez did not show for scheduled 11:00 AM evaluation.  Pt mobile number called x 2 and was not able to leave a message due to mailbox being full. Unable to reschedule because mailbox is full.  Garen Lah, PT, ATRIC Certified Exercise Expert for the Aging Adult  07/02/20 12:10 PM Phone: [phone] Fax: [phone]

## 2020-07-11 ENCOUNTER — Ambulatory Visit: Payer: Self-pay

## 2020-07-11 ENCOUNTER — Encounter (HOSPITAL_COMMUNITY): Payer: Self-pay | Admitting: Orthopedic Surgery

## 2020-07-11 ENCOUNTER — Other Ambulatory Visit: Payer: Self-pay

## 2020-07-11 DIAGNOSIS — I10 Essential (primary) hypertension: Secondary | ICD-10-CM | POA: Diagnosis present

## 2020-07-11 DIAGNOSIS — M25662 Stiffness of left knee, not elsewhere classified: Secondary | ICD-10-CM

## 2020-07-11 DIAGNOSIS — R2689 Other abnormalities of gait and mobility: Secondary | ICD-10-CM

## 2020-07-11 DIAGNOSIS — R600 Localized edema: Secondary | ICD-10-CM

## 2020-07-11 DIAGNOSIS — R262 Difficulty in walking, not elsewhere classified: Secondary | ICD-10-CM

## 2020-07-11 DIAGNOSIS — M25562 Pain in left knee: Secondary | ICD-10-CM

## 2020-07-11 NOTE — Therapy (Addendum)
[place], [place], [address] Phone: [phone]   Fax:  [phone]  Physical Therapy Evaluation/Discharge Summary  Patient Details  Name: Ricky Hernandez MRN: [id number] Date of Birth: 09-26-1960 Referring Provider (PT): Altamese Farnham, MD   Encounter Date: 07/11/2020   PT End of Session - 07/11/20 1219    Visit Number 1    Number of Visits 17    Date for PT Re-Evaluation 09/07/20    Authorization Type Self pay    PT Start Time 3545    PT Stop Time 1305    PT Time Calculation (min) 45 min    Equipment Utilized During Treatment Left knee immobilizer   L knee hinged brace locked in EXT   Activity Tolerance Patient tolerated treatment well    Behavior During Therapy [facility] for tasks assessed/performed           Past Medical History:  Diagnosis Date  . Hypertension     Past Surgical History:  Procedure Laterality Date  . QUADRICEPS TENDON REPAIR Left 06/25/2020   Procedure: REPAIR QUADRICEP TENDON;  Surgeon: Altamese Prado Verde, MD;  Location: [facility];  Service: Orthopedics;  Laterality: Left;  . REPAIR QUADRICEP TENDON (Left) Left 06/25/2020    There were no vitals filed for this visit.    Subjective Assessment - 07/11/20 1223    Subjective "I can walk through the house by myself without the walker. I'm doing pretty good. I'm not in any pain." Pt denies having any restrictions or precautions.    Pertinent History HTN    Limitations Walking;Standing;Lifting;House hold activities    How long can you sit comfortably? "I'm not in any pain."    How long can you stand comfortably? "I'm not in any pain."    How long can you walk comfortably? "I'm not in any pain."    Diagnostic tests L knee 06/25/2020: Interval development of subcutaneous emphysema in the distal medial  left thigh likely due to interval surgical intervention. If there  has been no surgical intervention, this is suspicious for developing  infection.--   MRI: Near complete rupture of the quadriceps tendon. There are some  intact fibers of the vastus lateralis.  2. Intact ligamentous structures and no meniscal tears.  3. Moderate-sized joint effusion and small leaking Baker's cyst. -- L hip: Ossifications along the left hip adductor musculature, right  pubic rami, and right hamstring tendon seem to be chronic and no  donor site is identified.  2. Mild spurring of the left femoral head.  3. Lower lumbar spondylosis.    Patient Stated Goals "Go back to work"    Currently in Pain? No/denies    Pain Score 0-No pain    Pain Location Knee    Pain Orientation Left    Aggravating Factors  "If I bump into something"    Pain Relieving Factors "Tylenol only when I bump into something"    Effect of Pain on Daily Activities Denies pain and states he is able to perform most desired activities but limited due to precautions/limitations and weakness              [facility] PT Assessment - 07/11/20 0001      Assessment   Medical Diagnosis Rupture of left quadriceps tendon, initial encounter (G25.638L)    Referring Provider (PT) Altamese [place], MD    Onset Date/Surgical Date 06/25/20    Hand Dominance Right    Next MD Visit Pt is unsure    Prior  Therapy No      Precautions   Precautions None      Restrictions   Weight Bearing Restrictions --   WBAT     Balance Screen   Has the patient fallen in the past 6 months No    Has the patient had a decrease in activity level because of a fear of falling?  Yes    Is the patient reluctant to leave their home because of a fear of falling?  Yes      [place] residence    Living Arrangements Parent;Other relatives   mother and nephew   Available Help at Discharge Family    Type of [place] to enter    Entrance Stairs-Number of Steps 3    Entrance Stairs-Rails Can reach both    [place] One level    [place] - 2 wheels   takes it  with him if he's alone for saftey but does not typically use     Prior Function   Level of Independence Independent;Independent with basic ADLs    Vocation Full time employment;Part time employment    Vocation Requirements [employer] 35 hours/week; [employer] 25 hours/week. Requires prolonged walking and standing. Plans to return to work when cleared.    Leisure BBQ      Cognition   Overall Cognitive Status Within Functional Limits for tasks assessed      Observation/Other Assessments   Focus on Therapeutic Outcomes (FOTO)  68% functional status; predicted 80%      ROM / Strength   AROM / PROM / Strength AROM;PROM;Strength      AROM   Overall AROM Comments Contraindicated at this time secondary to surgery      PROM   PROM Assessment Site Knee    Right/Left Knee Left    Left Knee Flexion 48      Strength   Overall Strength Comments Contraindicated at this time secondary to surgery      Palpation   Palpation comment No pain or tenderness with palpation along L knee      Ambulation/Gait   Ambulation/Gait Yes    Ambulation Distance (Feet) 100 Feet    Assistive device Rolling walker   with and without RW observed   Gait Pattern Step-through pattern    Ambulation Surface Level;Indoor                      Objective measurements completed on examination: See above findings.       [place] Adult PT Treatment/Exercise - 07/11/20 0001      Self-Care   Self-Care Other Self-Care Comments    Other Self-Care Comments  HEP, anatomy of condition, precautions/limitations per protocol ([place]), optimal RW height for improved posture in standing and during ambulation, gait with RW when using it, POC.      Exercises   Exercises Knee/Hip                  PT Education - 07/11/20 1921    Education Details HEP, anatomy of condition, precautions/limitations per protocol ([place]), optimal RW height for improved posture in standing  and during ambulation, gait with RW when using it, POC.    Person(s) Educated Patient    Methods Demonstration;Explanation;Tactile cues;Verbal cues;Handout    Comprehension Verbalized understanding;Returned demonstration;Verbal cues required;Tactile cues required;Need further instruction  PT Short Term Goals - 07/11/20 1932      PT SHORT TERM GOAL #1   Title Pt will be independent with initial HEP.    Baseline Pt provided initial HEP during evaluation 07/11/2020.    Time 4    Period Weeks    Status New    Target Date 08/08/20      PT SHORT TERM GOAL #2   Title Patient will demonstrate L knee AA/PROM at least 0-75 degrees.    Baseline 48 degrees AA/PROM at evaluation    Time 4    Period Weeks    Status New    Target Date 08/08/20      PT SHORT TERM GOAL #3   Title Patient will be able to perform 4-way leg lifts at least 10x each direction with L knee locked in EXT.    Baseline Did not attempt this session due to focus on initiating isometric and AA/PROM interventions primarily.    Time 4    Period Weeks    Status New    Target Date 08/08/20      PT SHORT TERM GOAL #4   Title Patient will be able to shift weight through LLE for at least 10 sec with hinged brace on and locked in EXT with no significant increase in pain.    Baseline Pt able to weightbear through LE - demonstrates abnormal gait pattern but denies pain. Weighshifting in static standing not tested at evaluation    Time 4    Period Weeks    Status New    Target Date 08/08/20             PT Long Term Goals - 07/11/20 1940      PT LONG TERM GOAL #1   Title Pt will be independent with advanced HEP.    Baseline Pt provided initial HEP during evaluation 07/11/2020.    Time 8    Period Weeks    Status New    Target Date 09/05/20      PT LONG TERM GOAL #2   Title Patient will be able to weightbear with knee flexion < 70 degrees to allow for closed chain quad control from 0-40 degrees.    Baseline  Not able secondary to precautions at this time.    Time 8    Period Weeks    Status New    Target Date 09/05/20      PT LONG TERM GOAL #3   Title Patient will be able to demonstrate more normalized gait pattern without trunk lean or decreased stance time on LLE.    Baseline Currently ambulates with decreased LLE stance time    Time 8    Period Weeks    Status New    Target Date 09/05/20      PT LONG TERM GOAL #4   Title Patient's FOTO score will improve from 68% to 80% functional status to demonstrate improved perceived ability.    Baseline 68% functional status; predicted 80% function    Time 8    Period Weeks    Status New    Target Date 09/05/20                  Plan - 07/11/20 1518    Clinical Impression Statement Patient is a 60 year old male who presents to [facility] s/p L quad tendon repair 06/25/2020. He was unsure of when his follow-up appointment is with his doctor but was provided with contact information to  call and find out/schedule an appointment. He is currently WBAT and is able to ambulate without RW. Pt's RW was initially very low, causing him to stoop forward when using and was adjusted to an appropriate height. He confirms that he has not removed hinged brace that is locked in EXT since having it. PT reviewed precautions and protocol with patient, advising him to perform isometric quad sets and AAROM (0-60 degrees per protocol) but no L knee AROM at this time. Incision partially inspected with no sign of infection noted at this time and no redness, warmth, or unexpected level of edema. He was instructed on proper positioning of hinged brace as it was initially lower down his L leg from him standing/walking. Pt will benefit from skilled PT intervention to return to desired activities as able per protocol.    Personal Factors and Comorbidities Age;Comorbidity 1;Fitness    Comorbidities HTN    Examination-Activity Limitations Carry;Lift;Locomotion  Level;Squat;Stairs;Stand    Examination-Participation Restrictions Community Activity;Occupation;Shop    Stability/Clinical Decision Making Stable/Uncomplicated    Clinical Decision Making Low    Rehab Potential Good    PT Frequency 2x / week    PT Duration 8 weeks    PT Treatment/Interventions ADLs/Self Care Home Management;Aquatic Therapy;Electrical Stimulation;Cryotherapy;Iontophoresis 4mg /ml Dexamethasone;Moist Heat;Neuromuscular re-education;Balance training;Therapeutic exercise;Therapeutic activities;Functional mobility training;Stair training;Gait training;Patient/family education;Manual techniques;Energy conservation;Dry needling;Passive range of motion;Scar mobilization;Taping;Vasopneumatic Device    PT Next Visit Plan Assess AA/PROM within ranges specified and initiate interventions per protocol. Gait training    PT Home Exercise Plan [id number] - isometric quad set, heel slide < 60 deg L knee FL within pain free range and no active ROM, glute set    Consulted and Agree with Plan of Care Patient           Patient will benefit from skilled therapeutic intervention in order to improve the following deficits and impairments:  Abnormal gait,Decreased activity tolerance,Decreased balance,Decreased mobility,Decreased strength,Decreased endurance,Decreased range of motion,Difficulty walking,Pain  Visit Diagnosis: Left knee pain, unspecified chronicity  Stiffness of left knee, not elsewhere classified  Difficulty in walking, not elsewhere classified  Other abnormalities of gait and mobility  Localized edema     Problem List Patient Active Problem List   Diagnosis Date Noted  . Hypertension   . Quadriceps tendon rupture, unspecified laterality, initial encounter 06/25/2020  . Rupture of left quadriceps muscle 06/25/2020     PHYSICAL THERAPY DISCHARGE SUMMARY  Visits from Start of Care: 1  Current functional level related to goals / functional outcomes: See above    Remaining deficits: See above   Education / Equipment: See above  Plan: Patient agrees to discharge.  Patient goals were not met. Patient is being discharged due to not returning since the last visit.  ?????     PT spoke with patient, and he said he does not really think he needs physical therapy because he went to the doctor Monday and was told he is doing well with recovery. He reports being able to walk around independently and do everything he needs to at home. He was informed him that PT would be beneficial for his strength and mobility especially after having had surgery, but it is ultimately up to him as we can't make him come to appointments and he has missed several appointments already. He voiced understanding that he would need a new referral for PT if he does decide to come but was not interested in coming in for PT at this time.     Haydee Monica,  PT, DPT 07/29/20 9:49 AM  [facility] [address] [place], [place], [address] Phone: [phone]   Fax:  [phone]  Name: Evertt Chouinard MRN: [id number] Date of Birth: 03-30-1961

## 2020-07-11 NOTE — Progress Notes (Deleted)
Patient ID: Ricky Hernandez, male   DOB: 09/09/1960, 60 y.o.   MRN: [id number]     Admit date: 06/24/2020 Discharge date: 06/26/2020  Admission Diagnoses: Left quadriceps tendon rupture Hypertension  Discharge Diagnoses:  Principal Problem:   Rupture of left quadriceps muscle Active Problems:   Hypertension

## 2020-07-16 ENCOUNTER — Telehealth: Payer: Self-pay | Admitting: Physical Therapy

## 2020-07-16 ENCOUNTER — Ambulatory Visit: Payer: Self-pay | Attending: Orthopedic Surgery | Admitting: Physical Therapy

## 2020-07-16 NOTE — Telephone Encounter (Signed)
Pt did not show for scheduled appt at 10:15 today.  Unable to leave message due to answering machine messages box full and not taking messages.   This is first No show.  Garen Lah, PT, ATRIC Certified Exercise Expert for the Aging Adult  07/16/20 10:53 AM Phone: [phone] Fax: [phone]

## 2020-07-18 ENCOUNTER — Other Ambulatory Visit: Payer: Self-pay

## 2020-07-18 ENCOUNTER — Ambulatory Visit: Payer: Self-pay | Attending: Physician Assistant | Admitting: Physician Assistant

## 2020-07-23 ENCOUNTER — Ambulatory Visit: Payer: Self-pay | Admitting: Physical Therapy

## 2020-07-23 ENCOUNTER — Telehealth: Payer: Self-pay | Admitting: Physical Therapy

## 2020-07-23 NOTE — Telephone Encounter (Signed)
Talked with Mr Ricky Hernandez and reminded him of 9:30 appt on 07-25-20. He has not shown for 2 appt. And was reminded of the attendance policy.  Pt was told he can have help if he has transportation issues.  Mr Ricky Hernandez stated he went to [facility] to get stitches out.  He stated he would attend on Thursday and will not miss his next appt.  Garen Lah, PT, ATRIC Certified Exercise Expert for the Aging Adult  07/23/20 2:50 PM Phone: [phone] Fax: [phone]

## 2020-07-25 ENCOUNTER — Telehealth: Payer: Self-pay | Admitting: Physical Therapy

## 2020-07-25 ENCOUNTER — Ambulatory Visit: Payer: Self-pay | Admitting: Physical Therapy

## 2020-07-25 NOTE — Telephone Encounter (Signed)
Attmepted to leave message but mailbox is full. Pt has not presented to 9:40 appt this morning thus far.  Pt is 3 weeks post patellar tendon repair and has only attended evaluation. Pt was called and reminded about this appt.   Mr Ricky Hernandez has no showed for 2 previous appt.   Garen Lah, PT, ATRIC Certified Exercise Expert for the Aging Adult  07/25/20 9:41 AM Phone: [phone] Fax: [phone]

## 2020-07-29 ENCOUNTER — Telehealth: Payer: Self-pay

## 2020-07-29 NOTE — Telephone Encounter (Signed)
PT spoke with patient, and he said he does not really think he needs physical therapy because he went to the doctor Monday and was told he is doing well with recovery. He reports being able to walk around independently and do everything he needs to at home. He was informed him that PT would be beneficial for his strength and mobility especially after having had surgery, but it is ultimately up to him as we can't make him come to appointments and he has missed several appointments already. He voiced understanding that he would need a new referral for PT if he does decide to come but was not interested in coming in for PT at this time.  Rhea Bleacher, PT, DPT 07/29/20 9:46 AM

## 2020-07-30 ENCOUNTER — Ambulatory Visit: Payer: Self-pay | Admitting: Physical Therapy

## 2020-08-01 ENCOUNTER — Encounter: Payer: Self-pay | Admitting: Physical Therapy

## 2020-08-06 ENCOUNTER — Encounter: Payer: Self-pay | Admitting: Physical Therapy

## 2020-08-08 ENCOUNTER — Encounter: Payer: Self-pay | Admitting: Physical Therapy

## 2020-08-13 ENCOUNTER — Encounter: Payer: Self-pay | Admitting: Physical Therapy

## 2020-08-15 ENCOUNTER — Encounter: Payer: Self-pay | Admitting: Physical Therapy

## 2020-08-20 ENCOUNTER — Encounter: Payer: Self-pay | Admitting: Physical Therapy

## 2020-08-22 ENCOUNTER — Encounter: Payer: Self-pay | Admitting: Physical Therapy

## 2021-06-16 IMAGING — DX DG KNEE 1-2V PORT*L*
1 series · 2 of 2 positions shown · non-contrast
Comparison: Left knee radiographs 06/24/2020

CLINICAL DATA: Rupture of left quadriceps muscle.

EXAM:
PORTABLE LEFT KNEE - 1-2 VIEW

[Series 1: knee · 0.14mm/px · 2 of 2 slices shown]
[im 1/2]
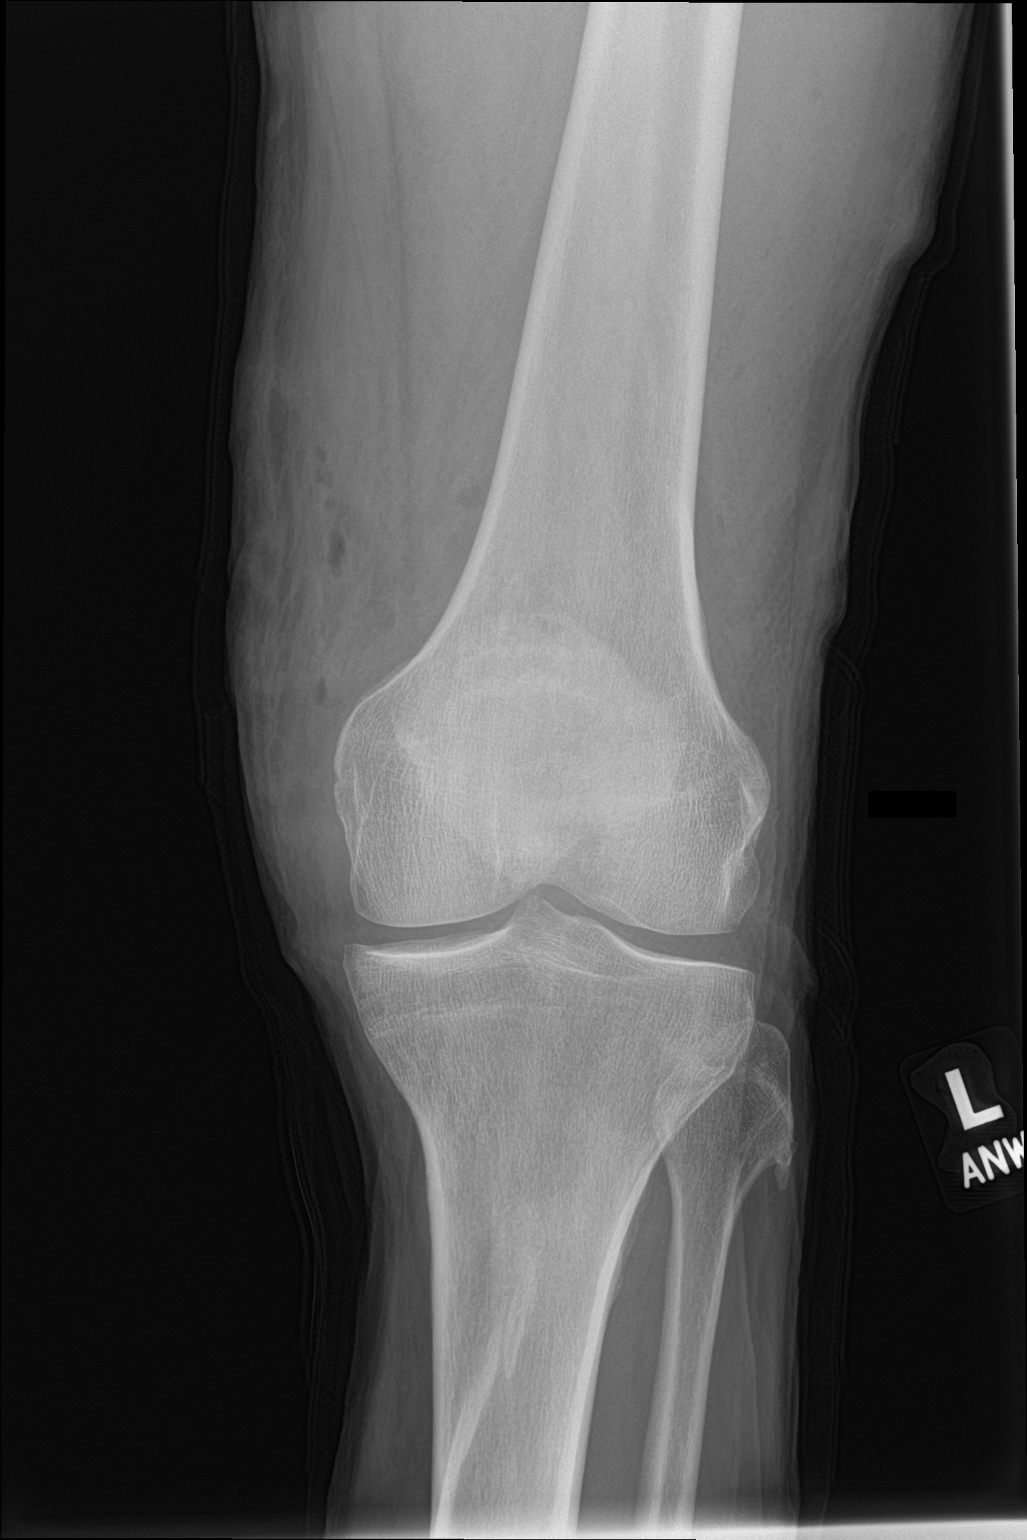
[im 2/2]
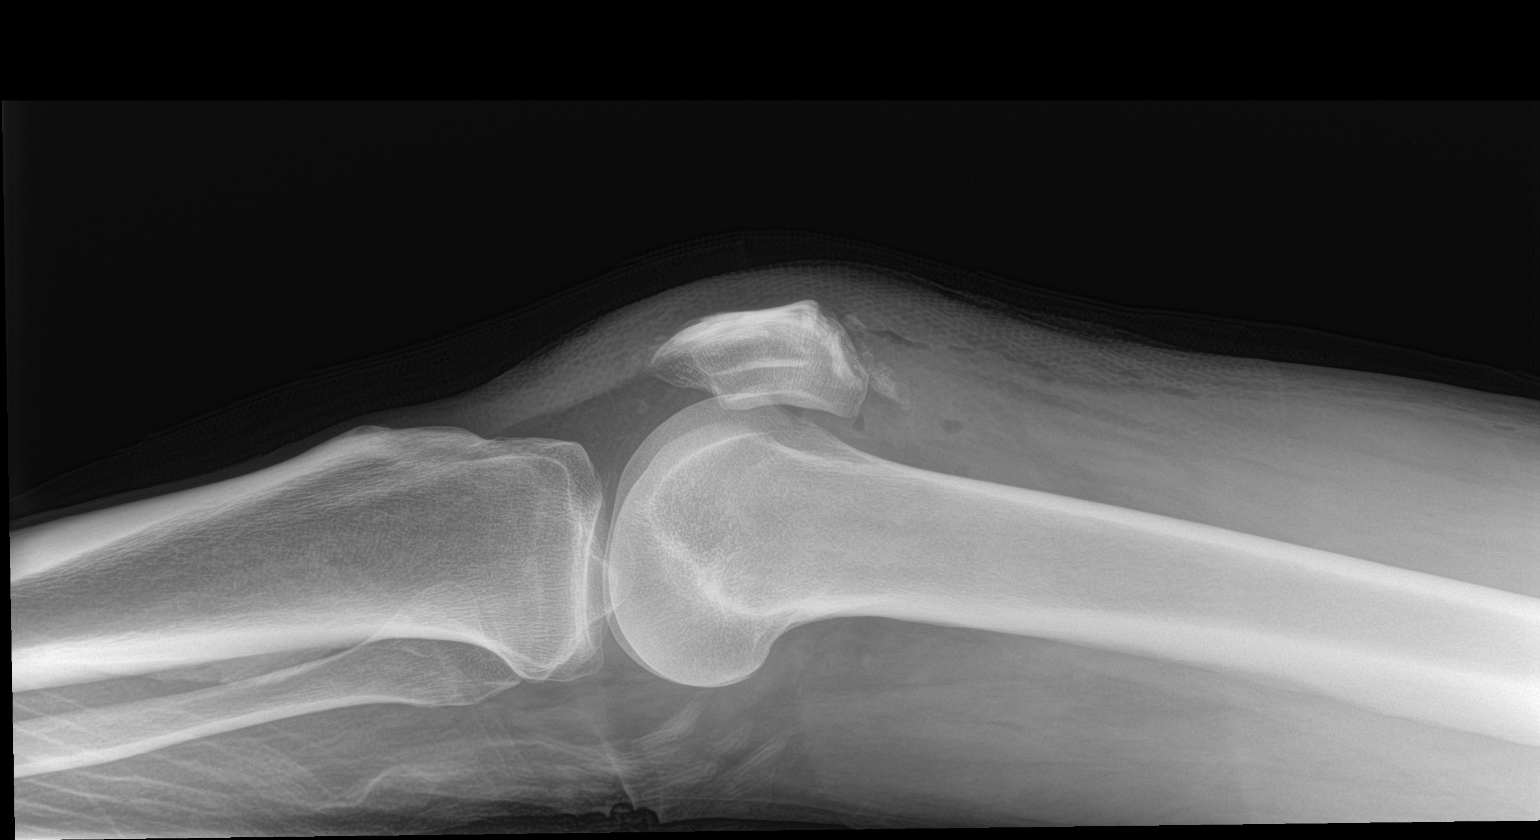

[2 of 2 positions shown; findings below may reference images not displayed]

FINDINGS: No fracture or dislocation. Interval development of subcutaneous
emphysema in the left distal medial thigh. Calcific densities again
noted superior to the patella, likely sequelae of remote trauma.
IMPRESSION: Interval development of subcutaneous emphysema in the distal medial
left thigh likely due to interval surgical intervention. If there
has been no surgical intervention, this is suspicious for developing
infection.
